# Patient Record
Sex: Female | Born: 1970 | Race: Black or African American | Hispanic: No | Marital: Single | Smoking: Never smoker
Health system: Southern US, Community
[De-identification: ages and names within clinical notes are randomized; demographics above are authoritative.]

## PROBLEM LIST (undated history)

## (undated) DIAGNOSIS — N390 Urinary tract infection, site not specified: Secondary | ICD-10-CM

## (undated) DIAGNOSIS — L0293 Carbuncle, unspecified: Secondary | ICD-10-CM

## (undated) DIAGNOSIS — L0292 Furuncle, unspecified: Secondary | ICD-10-CM

## (undated) HISTORY — PX: OTHER SURGICAL HISTORY: SHX169

## (undated) HISTORY — PX: INCISION AND DRAINAGE ABSCESS ANAL: SUR669

## (undated) HISTORY — DX: Furuncle, unspecified: L02.92

## (undated) HISTORY — PX: TUBAL LIGATION: SHX77

## (undated) HISTORY — PX: INCISE AND DRAIN ABCESS: PRO64

## (undated) HISTORY — DX: Carbuncle, unspecified: L02.93

---

## 1999-02-02 ENCOUNTER — Encounter: Payer: Self-pay | Admitting: Emergency Medicine

## 1999-02-02 ENCOUNTER — Emergency Department (HOSPITAL_COMMUNITY): Admission: EM | Admit: 1999-02-02 | Discharge: 1999-02-02 | Payer: Self-pay | Admitting: Emergency Medicine

## 1999-02-26 ENCOUNTER — Emergency Department (HOSPITAL_COMMUNITY): Admission: EM | Admit: 1999-02-26 | Discharge: 1999-02-26 | Payer: Self-pay | Admitting: Emergency Medicine

## 1999-02-28 ENCOUNTER — Emergency Department (HOSPITAL_COMMUNITY): Admission: EM | Admit: 1999-02-28 | Discharge: 1999-02-28 | Payer: Self-pay | Admitting: Emergency Medicine

## 2000-02-23 ENCOUNTER — Encounter (INDEPENDENT_AMBULATORY_CARE_PROVIDER_SITE_OTHER): Payer: Self-pay

## 2000-02-23 ENCOUNTER — Other Ambulatory Visit: Admission: RE | Admit: 2000-02-23 | Discharge: 2000-02-23 | Payer: Self-pay | Admitting: Obstetrics

## 2000-07-11 ENCOUNTER — Emergency Department (HOSPITAL_COMMUNITY): Admission: EM | Admit: 2000-07-11 | Discharge: 2000-07-11 | Payer: Self-pay | Admitting: Emergency Medicine

## 2002-04-30 ENCOUNTER — Emergency Department (HOSPITAL_COMMUNITY): Admission: EM | Admit: 2002-04-30 | Discharge: 2002-04-30 | Payer: Self-pay | Admitting: Emergency Medicine

## 2002-04-30 ENCOUNTER — Encounter: Payer: Self-pay | Admitting: Emergency Medicine

## 2002-05-13 ENCOUNTER — Encounter: Payer: Self-pay | Admitting: Obstetrics and Gynecology

## 2002-05-13 ENCOUNTER — Inpatient Hospital Stay (HOSPITAL_COMMUNITY): Admission: AD | Admit: 2002-05-13 | Discharge: 2002-05-13 | Payer: Self-pay | Admitting: Obstetrics and Gynecology

## 2002-05-26 ENCOUNTER — Encounter: Admission: RE | Admit: 2002-05-26 | Discharge: 2002-05-26 | Payer: Self-pay | Admitting: Obstetrics and Gynecology

## 2002-05-26 ENCOUNTER — Other Ambulatory Visit: Admission: RE | Admit: 2002-05-26 | Discharge: 2002-05-26 | Payer: Self-pay | Admitting: Obstetrics and Gynecology

## 2002-07-07 ENCOUNTER — Ambulatory Visit (HOSPITAL_COMMUNITY): Admission: RE | Admit: 2002-07-07 | Discharge: 2002-07-07 | Payer: Self-pay | Admitting: *Deleted

## 2002-12-30 ENCOUNTER — Emergency Department (HOSPITAL_COMMUNITY): Admission: EM | Admit: 2002-12-30 | Discharge: 2002-12-30 | Payer: Self-pay | Admitting: Emergency Medicine

## 2003-01-01 ENCOUNTER — Emergency Department (HOSPITAL_COMMUNITY): Admission: EM | Admit: 2003-01-01 | Discharge: 2003-01-01 | Payer: Self-pay | Admitting: Emergency Medicine

## 2005-01-24 ENCOUNTER — Emergency Department (HOSPITAL_COMMUNITY): Admission: EM | Admit: 2005-01-24 | Discharge: 2005-01-24 | Payer: Self-pay | Admitting: Family Medicine

## 2005-06-04 ENCOUNTER — Emergency Department (HOSPITAL_COMMUNITY): Admission: EM | Admit: 2005-06-04 | Discharge: 2005-06-04 | Payer: Self-pay | Admitting: Family Medicine

## 2005-06-12 ENCOUNTER — Inpatient Hospital Stay (HOSPITAL_COMMUNITY): Admission: AD | Admit: 2005-06-12 | Discharge: 2005-06-13 | Payer: Self-pay | Admitting: *Deleted

## 2005-06-28 ENCOUNTER — Ambulatory Visit (HOSPITAL_COMMUNITY): Admission: RE | Admit: 2005-06-28 | Discharge: 2005-06-28 | Payer: Self-pay | Admitting: *Deleted

## 2005-07-03 ENCOUNTER — Encounter (INDEPENDENT_AMBULATORY_CARE_PROVIDER_SITE_OTHER): Payer: Self-pay | Admitting: Family Medicine

## 2005-07-03 LAB — CONVERTED CEMR LAB

## 2005-08-07 ENCOUNTER — Ambulatory Visit (HOSPITAL_COMMUNITY): Admission: RE | Admit: 2005-08-07 | Discharge: 2005-08-07 | Payer: Self-pay | Admitting: *Deleted

## 2005-08-07 ENCOUNTER — Ambulatory Visit: Payer: Self-pay | Admitting: *Deleted

## 2005-08-14 ENCOUNTER — Ambulatory Visit (HOSPITAL_COMMUNITY): Admission: RE | Admit: 2005-08-14 | Discharge: 2005-08-14 | Payer: Self-pay | Admitting: *Deleted

## 2006-01-15 ENCOUNTER — Inpatient Hospital Stay (HOSPITAL_COMMUNITY): Admission: AD | Admit: 2006-01-15 | Discharge: 2006-01-17 | Payer: Self-pay | Admitting: Family Medicine

## 2006-01-15 ENCOUNTER — Ambulatory Visit: Payer: Self-pay | Admitting: Obstetrics and Gynecology

## 2006-07-08 ENCOUNTER — Emergency Department (HOSPITAL_COMMUNITY): Admission: EM | Admit: 2006-07-08 | Discharge: 2006-07-08 | Payer: Self-pay | Admitting: Family Medicine

## 2006-07-24 ENCOUNTER — Ambulatory Visit: Payer: Self-pay | Admitting: Family Medicine

## 2006-07-24 ENCOUNTER — Encounter (INDEPENDENT_AMBULATORY_CARE_PROVIDER_SITE_OTHER): Payer: Self-pay | Admitting: Family Medicine

## 2006-09-30 ENCOUNTER — Encounter (INDEPENDENT_AMBULATORY_CARE_PROVIDER_SITE_OTHER): Payer: Self-pay | Admitting: Family Medicine

## 2007-01-15 ENCOUNTER — Emergency Department (HOSPITAL_COMMUNITY): Admission: EM | Admit: 2007-01-15 | Discharge: 2007-01-15 | Payer: Self-pay | Admitting: Family Medicine

## 2007-04-30 ENCOUNTER — Ambulatory Visit: Payer: Self-pay | Admitting: Family Medicine

## 2007-04-30 ENCOUNTER — Encounter (INDEPENDENT_AMBULATORY_CARE_PROVIDER_SITE_OTHER): Payer: Self-pay | Admitting: Family Medicine

## 2007-04-30 ENCOUNTER — Other Ambulatory Visit: Admission: RE | Admit: 2007-04-30 | Discharge: 2007-04-30 | Payer: Self-pay | Admitting: Family Medicine

## 2007-04-30 LAB — CONVERTED CEMR LAB: Pap Smear: NORMAL

## 2008-01-12 ENCOUNTER — Ambulatory Visit: Payer: Self-pay | Admitting: Family Medicine

## 2008-01-12 DIAGNOSIS — L0292 Furuncle, unspecified: Secondary | ICD-10-CM | POA: Insufficient documentation

## 2008-01-12 DIAGNOSIS — L0293 Carbuncle, unspecified: Secondary | ICD-10-CM

## 2008-01-12 HISTORY — DX: Furuncle, unspecified: L02.92

## 2008-01-27 ENCOUNTER — Encounter: Payer: Self-pay | Admitting: *Deleted

## 2008-08-02 ENCOUNTER — Encounter (INDEPENDENT_AMBULATORY_CARE_PROVIDER_SITE_OTHER): Payer: Self-pay | Admitting: Family Medicine

## 2008-08-02 ENCOUNTER — Ambulatory Visit: Payer: Self-pay | Admitting: Family Medicine

## 2008-08-02 ENCOUNTER — Other Ambulatory Visit: Admission: RE | Admit: 2008-08-02 | Discharge: 2008-08-02 | Payer: Self-pay | Admitting: Family Medicine

## 2008-08-03 LAB — CONVERTED CEMR LAB: Pap Smear: NEGATIVE

## 2008-08-05 ENCOUNTER — Encounter (INDEPENDENT_AMBULATORY_CARE_PROVIDER_SITE_OTHER): Payer: Self-pay | Admitting: Family Medicine

## 2008-08-05 LAB — CONVERTED CEMR LAB
AST: 14 units/L (ref 0–37)
Albumin: 4 g/dL (ref 3.5–5.2)
BUN: 10 mg/dL (ref 6–23)
Chloride: 106 meq/L (ref 96–112)
Cholesterol: 149 mg/dL (ref 0–200)
Glucose, Bld: 101 mg/dL — ABNORMAL HIGH (ref 70–99)
LDL Cholesterol: 82 mg/dL (ref 0–99)
Potassium: 3.9 meq/L (ref 3.5–5.3)
Sodium: 141 meq/L (ref 135–145)
Total Bilirubin: 0.7 mg/dL (ref 0.3–1.2)
Total CHOL/HDL Ratio: 2.7
Triglycerides: 62 mg/dL (ref <150)
VLDL: 12 mg/dL (ref 0–40)

## 2009-09-09 ENCOUNTER — Emergency Department (HOSPITAL_COMMUNITY): Admission: EM | Admit: 2009-09-09 | Discharge: 2009-09-09 | Payer: Self-pay | Admitting: Emergency Medicine

## 2009-09-11 ENCOUNTER — Emergency Department (HOSPITAL_COMMUNITY): Admission: EM | Admit: 2009-09-11 | Discharge: 2009-09-11 | Payer: Self-pay | Admitting: Emergency Medicine

## 2009-09-21 ENCOUNTER — Encounter: Payer: Self-pay | Admitting: *Deleted

## 2009-11-16 ENCOUNTER — Encounter: Payer: Self-pay | Admitting: Family Medicine

## 2009-11-16 ENCOUNTER — Ambulatory Visit: Payer: Self-pay | Admitting: Family Medicine

## 2009-11-16 LAB — CONVERTED CEMR LAB
Bilirubin Urine: NEGATIVE
Chlamydia, DNA Probe: NEGATIVE
GC Probe Amp, Genital: NEGATIVE
Glucose, Urine, Semiquant: NEGATIVE
Ketones, urine, test strip: NEGATIVE
Nitrite: NEGATIVE
Protein, U semiquant: 30
Specific Gravity, Urine: >=1.03
Urobilinogen, UA: 0.2
pH: 5.5

## 2009-11-17 ENCOUNTER — Telehealth: Payer: Self-pay | Admitting: *Deleted

## 2010-02-20 ENCOUNTER — Encounter: Payer: Self-pay | Admitting: Sports Medicine

## 2010-05-16 NOTE — Assessment & Plan Note (Signed)
Summary: uti?,df   Vital Signs:  Patient profile:   40 year old female Height:      66 inches Weight:      165 pounds BMI:     26.73 Temp:     99.0 degrees F oral Pulse rate:   72 / minute BP sitting:   128 / 82  (left arm) Cuff size:   regular  Vitals Entered By: Tessie Fass CMA (November 16, 2009 1:32 PM) CC: dysuria/ vaginal discharge Is Patient Diabetic? No Pain Assessment Patient in pain? no        Primary Care Provider:  Drue Dun MD  CC:  dysuria/ vaginal discharge.  History of Present Illness: 40 year old F:  1. Vaginal Discharge: since sexual intercourse with boyfriend  ~ 1 week ago, described as green, frothy, no itch or odor. no abdominal pain, fever/chills, N/V/D. + dysuria. she thinks that she was "scratched on the inside," so urinating burns. no vaginal bleeding.  Current Medications (verified): 1)  None  Allergies (verified): No Known Drug Allergies PMH-FH-SH reviewed for relevance  Review of Systems General:  Denies chills and fever. GU:  Complains of discharge and dysuria; denies abnormal vaginal bleeding and genital sores. Derm:  Denies rash.  Physical Exam  General:  Well-developed, well-nourished, no acute distress.  Alert and oriented x 3. Vitals reviewed. Genitalia:  Normal introitus, 4 external healing cysts (?hidradenitis), copious green, frothy vaginal discharge - samples taken, mucosa pink and moist, and no friaility or hemorrhage.  No internal/external abrasions noted. Psych:  Oriented X3, memory intact for recent and remote, normally interactive, good eye contact, not anxious appearing, and not depressed appearing.     Impression & Recommendations:  Problem # 1:  VAGINAL DISCHARGE (ICD-623.5) Assessment New Wet Prep negative. GC/Chlamydia pending. Orders: Wet Prep- [facility] (89381) [facility]- Est Level  3 (01751)  Problem # 2:  DYSURIA (ICD-788.1) Assessment: New UA + LE, WBC TNTC. Rx Keflex. Her updated medication list for this  problem includes:    Cephalexin 500 Mg Tabs (Cephalexin) .Marland Kitchen... Take one by mouth 3 times daily x 3 days  Orders: Urinalysis-[facility] (00000) GC/Chlamydia-[facility] (87591/87491) [facility]- Est Level  3 (02585)  Problem # 3:  UTI (ICD-599.0)  Her updated medication list for this problem includes:    Cephalexin 500 Mg Tabs (Cephalexin) .Marland Kitchen... Take one by mouth 3 times daily x 3 days  Problem # 4:  BOILS, RECURRENT (ICD-680.9)  Complete Medication List: 1)  Cephalexin 500 Mg Tabs (Cephalexin) .... Take one by mouth 3 times daily x 3 days  Patient Instructions: 1)  You have a UTI. I am prescribing Kelex for this. 2)  We will call with the results of your GC/Chlamydia tests. Prescriptions: CEPHALEXIN 500 MG  TABS (CEPHALEXIN) take one by mouth 3 times daily x 3 days  #9 x 0   Entered and Authorized by:   Helane Rima DO   Signed by:   Helane Rima DO on 11/16/2009   Method used:   Electronically to        [facility]  [address] [phone]* (retail)       [address]       [place], [place]  [id number]       Ph: [phone] or [phone]       Fax: [phone]   RxID:   [id number] CEPHALEXIN 500 MG  TABS (CEPHALEXIN) take one by mouth 3 times daily x 3 days  #  9 x 0   Entered and Authorized by:   Helane Rima DO   Signed by:   Helane Rima DO on 11/16/2009   Method used:   Print then Give to Patient   RxID:   [id number]   Laboratory Results   Urine Tests  Date/Time Received: November 16, 2009 1:41 PM  Date/Time Reported: November 16, 2009 2:09 PM   Routine Urinalysis   Color: yellow Appearance: slightly Cloudy Glucose: negative   (Normal Range: Negative) Bilirubin: small;  reflex ictotest = negative   (Normal Range: Negative) Ketone: negative   (Normal Range: Negative) Spec. Gravity: >=1.030   (Normal Range: 1.003-1.035) Blood: large   (Normal Range: Negative) pH: 5.5   (Normal Range: 5.0-8.0) Protein: 30   (Normal Range: Negative) Urobilinogen:  0.2   (Normal Range: 0-1) Nitrite: negative   (Normal Range: Negative) Leukocyte Esterace: moderate   (Normal Range: Negative)  Urine Microscopic WBC/HPF: TNTC RBC/HPF: 0-3 Bacteria/HPF: 2+ Mucous/HPF: 2+ Epithelial/HPF: 5-10 Yeast/HPF: occ    Comments: 4 cc spun ...............test performed by......Marland KitchenBonnie A. [place]az[place], MLS (ASCP)cm  Date/Time Received: November 16, 2009 1:49 PM  Date/Time Reported: November 16, 2009 2:03 PM   [employer] Source: vaginal WBC/hpf: >20 Bacteria/hpf: 3+ Clue cells/hpf: none Yeast/hpf: none Trichomonas/hpf: none Comments: ...........test performed by...........Marland KitchenTerese Door, CMA

## 2010-05-16 NOTE — Miscellaneous (Signed)
Summary: NPI given  Clinical Lists Changes gave [facility] our NPI. she had gone top [facility] ED for an abcess on her back. they sent her to [facility]. she has a f/u appt on the 13th of June. NPI given.Golden Circle RN  September 21, 2009 11:37 AM

## 2010-05-16 NOTE — Miscellaneous (Signed)
  Clinical Lists Changes  Problems: Removed problem of UTI (ICD-599.0) Removed problem of VAGINAL DISCHARGE (ICD-623.5) Removed problem of CONTACT OR EXPOSURE TO OTHER VIRAL DISEASES (ICD-V01.79) Removed problem of DYSURIA (ICD-788.1) Removed problem of SCREENING OTHER&UNSPEC CARDIOVASCULAR CONDITIONS (ICD-V81.2) Removed problem of SCREENING FOR LIPOID DISORDERS (ICD-V77.91) Removed problem of SCREENING FOR MALIGNANT NEOPLASM OF THE CERVIX (ICD-V76.2) Removed problem of HEALTH MAINTENANCE EXAM (ICD-V70.0) Removed problem of ROUTINE GYNECOLOGICAL EXAMINATION (ICD-V72.31)

## 2010-05-16 NOTE — Progress Notes (Signed)
Summary: re: std results/ts  ---- Converted from flag ---- ---- 11/17/2009 7:59 AM, Helane Rima DO wrote: please let patient know that her GC/Chlm tests were negative. if she continues to have vaginal discharge after another 1-2 weeks, f/u for retest. ------------------------------  called pt and informed of above.

## 2010-05-20 ENCOUNTER — Emergency Department (HOSPITAL_COMMUNITY)
Admission: EM | Admit: 2010-05-20 | Discharge: 2010-05-20 | Disposition: A | Discharge status: Home / Self Care | Payer: Medicaid Other | Attending: Emergency Medicine | Admitting: Emergency Medicine

## 2010-05-20 DIAGNOSIS — K649 Unspecified hemorrhoids: Secondary | ICD-10-CM | POA: Insufficient documentation

## 2010-05-20 DIAGNOSIS — K6289 Other specified diseases of anus and rectum: Secondary | ICD-10-CM | POA: Insufficient documentation

## 2010-08-25 ENCOUNTER — Encounter: Payer: Medicaid Other | Admitting: Sports Medicine

## 2010-09-01 NOTE — Op Note (Signed)
NAMEJULISSA, Mallory Lin              ACCOUNT NO.:  [id number]   MEDICAL RECORD NO.:  [id number]          PATIENT TYPE:  INP   LOCATION:  9106                          FACILITY:  [facility]   PHYSICIAN:  [name], M.D.DATE OF BIRTH:  [birth year]   DATE OF PROCEDURE:  01/16/2006  DATE OF DISCHARGE:                                 OPERATIVE REPORT   PREOPERATIVE DIAGNOSIS:  Multiparity, desires permanent sterilization.   POST PROCEDURE DIAGNOSIS:  Multiparity, desires permanent sterilization.   PROCEDURE:  Postpartum tubal ligation with Filshie clips.   ANESTHESIA:  General.   SURGEON:  Tinnie Gens, M.D.   ASSISTANT:  Carolanne Grumbling, M.D.   ESTIMATED BLOOD LOSS:  Minimal.   PROCEDURE:  The patient was taken to the operating room where general  anesthesia was done and found to be adequate.  She was then prepped and  draped in the usual sterile fashion.  The area of skin that was about to be  incised was anesthetized with 0.25% Marcaine, then infraumbilical incision  was made and carried through until the abdominal cavity was entered.  Next,  the left fallopian tube was identified and grasped with Babcock clamp and  brought through the incision.  It was then followed out to the fimbriae.  Next, a Filshie clip was placed without difficulty.  Fallopian tube was then  returned to the abdominal cavity without difficulty.  Attention was then  turned to the right fallopian tube which, in a similar fashion, was grasped  with a Babcock clamp and followed down to the fimbriae.  A Filshie clip was  then placed, completely occluding the fallopian tube.  Fallopian tube was  returned to the cavity without difficulty.  Excellent hemostasis was noted.  Fascia was closed with 0 Vicryl in a running fashion.  Skin was closed with  4-0 Vicryl.  The patient tolerated procedure well.  There were no  complications.           ______________________________  [name], M.D.     TLW/MEDQ  D:   01/16/2006  T:  01/17/2006  Job:  [id number]

## 2010-09-18 ENCOUNTER — Emergency Department (HOSPITAL_COMMUNITY)
Admission: EM | Admit: 2010-09-18 | Discharge: 2010-09-18 | Disposition: A | Discharge status: Home / Self Care | Payer: Medicaid Other | Attending: Emergency Medicine | Admitting: Emergency Medicine

## 2010-09-18 ENCOUNTER — Emergency Department (HOSPITAL_COMMUNITY): Payer: Medicaid Other

## 2010-09-18 DIAGNOSIS — J069 Acute upper respiratory infection, unspecified: Secondary | ICD-10-CM | POA: Insufficient documentation

## 2010-09-18 DIAGNOSIS — R509 Fever, unspecified: Secondary | ICD-10-CM | POA: Insufficient documentation

## 2010-09-18 DIAGNOSIS — J029 Acute pharyngitis, unspecified: Secondary | ICD-10-CM | POA: Insufficient documentation

## 2010-09-18 DIAGNOSIS — R05 Cough: Secondary | ICD-10-CM | POA: Insufficient documentation

## 2010-09-18 DIAGNOSIS — IMO0001 Reserved for inherently not codable concepts without codable children: Secondary | ICD-10-CM | POA: Insufficient documentation

## 2010-09-18 DIAGNOSIS — R1110 Vomiting, unspecified: Secondary | ICD-10-CM | POA: Insufficient documentation

## 2010-09-18 DIAGNOSIS — R059 Cough, unspecified: Secondary | ICD-10-CM | POA: Insufficient documentation

## 2010-11-24 ENCOUNTER — Ambulatory Visit (INDEPENDENT_AMBULATORY_CARE_PROVIDER_SITE_OTHER): Payer: Medicaid Other | Admitting: Family Medicine

## 2010-11-24 ENCOUNTER — Encounter: Payer: Self-pay | Admitting: Family Medicine

## 2010-11-24 VITALS — BP 129/76 | HR 62 | Ht 65.5 in | Wt 161.0 lb

## 2010-11-24 DIAGNOSIS — L732 Hidradenitis suppurativa: Secondary | ICD-10-CM | POA: Insufficient documentation

## 2010-11-24 NOTE — Patient Instructions (Addendum)
It was good to meet you today. I will refer you to surgery to see if they have any further recommendations.  Sometimes they are successful in helping people with problems like yours.   It has a long fancy name, as below.     Hidradenitis Suppurativa, Sweat Gland Abscess Hidradenitis suppurativa is a long lasting (chronic), uncommon disease of the sweat glands. With this, boil-like lumps and scarring develop in the groin, some times under the arms (axillae), and under the breasts. It may also uncommonly occur behind the ears, in the crease of the buttocks, and around the genitals.   CAUSES The cause is from a blocking of the sweat glands. They then become infected. It may cause drainage and odor. It is not contagious. So it cannot be given to someone else. It most often shows up in puberty (about 37 to 40 years of age). But it may happen much later. It is similar to acne which is a disease of the sweat glands. This condition is slightly more common in African-Americans and women. SYMPTOMS  Hidradenitis usually starts as one or more red, tender, swellings in the groin or under the arms (axilla).   Over a period of hours to days the lesions get larger. They often open to the skin surface, draining clear to yellow-colored fluid.   The infected area heals with scarring.  DIAGNOSIS Your caregiver makes this diagnosis by looking at you. Sometimes cultures (growing germs on plates in the lab) may be taken. This is to see what germ (bacterium) is causing the infection.   TREATMENT & HOME CARE INSTRUCTIONS  Topical germ killing medicine applied to the skin (antibiotics) are the treatment of choice. Antibiotics taken by mouth (systemic) are sometimes needed when the condition is getting worse or is severe.   Avoid tight-fitting clothing which traps moisture in.   Dirt does not cause hidradenitis and it is not caused by poor hygiene.   Involved areas should be cleaned daily using an antibacterial soap.  Some patients find that the liquid form of Lever 2000, applied to the involved areas as a lotion after bathing, can help reduce the odor related to this condition.   Sometimes surgery is needed to drain infected areas or remove scarred tissue. Removal of large amounts of tissue is used only in severe cases.   Birth control pills may be helpful.   Oral retinoids (vitamin A derivatives) for 6 to 12 months which are effective for acne may also help this condition.   Weight loss will improve but not cure hidradenitis. It is made worse by being overweight. But the condition is not caused by being overweight.   This condition is more common in people who have had acne.   It may become worse under stress.  There is no medical cure for hidradenitis. It can be controlled, but not cured. The condition usually continues for years with periods of getting worse and getting better (remission). Document Released: 11/15/2003 Document Re-Released: 01/10/2008 [employer] Patient Information 2011 [place], [place].

## 2010-11-24 NOTE — Progress Notes (Signed)
  Subjective:    Patient ID: Mallory Lin, female    DOB: Jun 05, 1970, 40 y.o.   MRN: [id number]  HPI 40 yo F here for CPE and b/c of recurrent boils:  1.  Boils:  Has had problem for greater than 6 years.  Mother and sister also with recurrent boils.  Has had multiple I&D's in past, also drains them on her own as well.  Recur every 1-2 months, mostly in axillae and labial folds.  Would like to know what else she can do for this.  No fevers, chills, current drainage from sites.     Review of Systems The patient denies fever, unusual weight change, decreased hearing, chest pain, palpitations, pre-syncopal or syncopal episodes, dyspnea on exertion, prolonged cough, hemoptysis, change in bowel habits, melena, hematochezia, severe indigestion/heartburn, nausea/vomiting/abdominal pain, genital sores, muscle weakness, difficulty walking, abnormal bleeding.       Objective:   Physical Exam Gen:  Alert, cooperative patient who appears stated age in no acute distress.  Vital signs reviewed. HEENT:  [place]/AT.  EOMI, PERRL.  MMM, tonsils non-erythematous, non-edematous.  External ears WNL, Bilateral TM's normal without retraction, redness or bulging.  Neck: No masses or thyromegaly or limitation in range of motion.  No cervical lymphadenopathy. Pulm:  Clear to auscultation bilaterally with good air movement.  No wheezes or rales noted.   Cardiac:  Regular rate and rhythm without murmur auscultated.  Good S1/S2. Abd:  Soft/nondistended/nontender.  Good bowel sounds throughout all four quadrants.  No masses noted.  Ext:  No clubbing/cyanosis/erythema.  No edema noted bilateral lower extremities.   Skin:  Patient examined with chaperone.  Multiple areas of induration with evidence of recent self-instrumentaton and drainage.  No current areas of fluctuance.  Labial folds also with multiple areas of induration, R > L.  No areas of fluctuance here either        Assessment & Plan:

## 2010-11-24 NOTE — Assessment & Plan Note (Addendum)
Long-standing problem for patient. Plan to refer to surgery for further recommendations.  Nothing to drain today.   Do not think adding antibiotics to regimen would help at this time.   Will fu after visit with surgery.

## 2010-12-06 ENCOUNTER — Telehealth: Payer: Self-pay | Admitting: *Deleted

## 2010-12-06 NOTE — Telephone Encounter (Signed)
Informed pt of appt at [facility] [address] on 08.28.2012 @ 330 pm with Dr. Gerrit Friends. Told her that if she is unable to keep this appt she can call to cancel/reschedule at [phone]. Pt agreed and understood.Mallory Lin

## 2010-12-12 ENCOUNTER — Ambulatory Visit (INDEPENDENT_AMBULATORY_CARE_PROVIDER_SITE_OTHER): Payer: Medicaid Other | Admitting: Surgery

## 2011-01-10 ENCOUNTER — Encounter (INDEPENDENT_AMBULATORY_CARE_PROVIDER_SITE_OTHER): Payer: Self-pay | Admitting: Surgery

## 2011-01-10 ENCOUNTER — Ambulatory Visit (INDEPENDENT_AMBULATORY_CARE_PROVIDER_SITE_OTHER): Payer: Medicaid Other | Admitting: Surgery

## 2011-01-10 DIAGNOSIS — L732 Hidradenitis suppurativa: Secondary | ICD-10-CM

## 2011-01-10 NOTE — Patient Instructions (Signed)
Wound care as instructed.  Add anti-bacterial soap.  TMG

## 2011-01-10 NOTE — Progress Notes (Signed)
Visit Diagnoses: 1. Hidradenitis suppurativa     HISTORY: Patient is a 40 year old female previously treated in our practice for an abscess of the buttock. Patient has long-standing hidradenitis. This is being managed by her primary care physician. Patient notes involvement of both axillae and both groins and the pubic region. Currently hidradenitis is under reasonable control. She has never been evaluated by a dermatologist.   PERTINENT REVIEW OF SYSTEMS: No current drainage. Minimal discomfort.   EXAM: HEENT: normocephalic; pupils equal and reactive; sclerae clear; dentition good; mucous membranes moist NECK:  symmetric on extension; no palpable anterior or posterior cervical lymphadenopathy; no supraclavicular masses; no tenderness CHEST: clear to auscultation bilaterally without rales, rhonchi, or wheezes CARDIAC: regular rate and rhythm without significant murmur; peripheral pulses are full EXT:  non-tender without edema; no deformity NEURO: no gross focal deficits; no sign of tremor SKIN:  Stigmata of hidradenitis involving both axillae, greater on the left than on the right, and evidence of hidradenitis involving the medial right thigh and suprapubic region. No active drainage. No fluctuance. No sign of cellulitis.   IMPRESSION: Hidradenitis suppurativa involving the axillae, right groin, and suprapubic region, quiescent   PLAN: Usual instructions for management of hidradenitis are provided to the patient. Written information on management of hidradenitis is also given to the patient.  At this point there is no roll for acute surgical intervention.  Patient may benefit from consultation with dermatology for long-term management. I will leave this up to her primary care provider.  Patient will return to our practice as needed.   Velora Heckler, MD, FACS General & Endocrine Surgery [facility]

## 2011-02-20 ENCOUNTER — Encounter: Payer: Self-pay | Admitting: Family Medicine

## 2011-02-20 ENCOUNTER — Ambulatory Visit (INDEPENDENT_AMBULATORY_CARE_PROVIDER_SITE_OTHER): Payer: Medicaid Other | Admitting: Family Medicine

## 2011-02-20 DIAGNOSIS — L732 Hidradenitis suppurativa: Secondary | ICD-10-CM

## 2011-02-20 MED ORDER — SULFAMETHOXAZOLE-TMP DS 800-160 MG PO TABS: 1.0000 | ORAL_TABLET | Freq: Two times a day (BID) | ORAL | Status: DC

## 2011-02-20 NOTE — Assessment & Plan Note (Signed)
This is a chronic issue for patient. Appreciate general surgery consult-no indication for surgery. Will give prescription for Bactrim DS one tablet twice a day for 10 days. Will refer patient to dermatology for further management and recommendations. Advised patient to return to clinic if she experiences persistent fevers, nausea vomiting, or worsening boils.  If symptoms do not improve, patient to followup with PCP in 2 weeks.

## 2011-02-20 NOTE — Patient Instructions (Signed)
It was nice to meet you today. Please take Bactrim as directed x 10 days. Please follow up with your PCP if symptoms do not improve in 2 weeks. I will refer you to a dermatologist for further management. Thank you.

## 2011-02-20 NOTE — Progress Notes (Signed)
  Subjective:    Patient ID: [name], female    DOB: 12-May-1970, 40 y.o.   MRN: [id number]  HPI  Patient presents as a work in for multiple recurrent boils. Patient states that she typically has multiple boils on her back, groin area, and arms. Today, however, she has only 2 boils located on bilateral axillary regions. Patient was seen by [facility] surgery. At that visit, patient was given instructions for supportive care of hydradenitis suppurativa. There was no indication for surgery at that time. Patient says that the lesion under her right axillary recently drained pus and minimal blood. Lesions are nontender and not itchy. She describes them as irritating and would like to have them removed.  Patient denies any fever, chills, night sweats, rash. Endorses dry skin and a few boils under her arms. No evidence of acute infection.   Review of Systems  Per history of present illness     Objective:   Physical Exam  General: in no acute distress Skin: right axilla- 2 lesions, mild erythema, no active pus, bleeding, or drainage; left axilla - one round, fluctuant boil (about 2 mm x 3 mm), no active pus, bleeding, or drainage      Assessment & Plan:

## 2011-03-30 ENCOUNTER — Encounter: Payer: Self-pay | Admitting: Family Medicine

## 2011-03-30 ENCOUNTER — Ambulatory Visit (INDEPENDENT_AMBULATORY_CARE_PROVIDER_SITE_OTHER): Payer: Medicaid Other | Admitting: Family Medicine

## 2011-03-30 VITALS — BP 132/80 | HR 60 | Temp 98.6°F | Ht 66.0 in | Wt 165.0 lb

## 2011-03-30 DIAGNOSIS — R300 Dysuria: Secondary | ICD-10-CM

## 2011-03-30 DIAGNOSIS — R1031 Right lower quadrant pain: Secondary | ICD-10-CM | POA: Insufficient documentation

## 2011-03-30 LAB — POCT URINALYSIS DIPSTICK
Bilirubin, UA: NEGATIVE
Ketones, UA: NEGATIVE
Protein, UA: NEGATIVE
Spec Grav, UA: 1.02
Urobilinogen, UA: 1
pH, UA: 7

## 2011-03-30 NOTE — Assessment & Plan Note (Addendum)
Benign abdomen. Not a surgical problem. No red flags on history or physical. Check a UA as patient is diagnosed with UTI previously with similar symptoms. Await results Ibuprofen for relief.

## 2011-03-30 NOTE — Progress Notes (Signed)
  Subjective:    Patient ID: [name], female    DOB: [birth year], 40 y.o.   MRN: [id number]  HPI 1. Right-sided pain: Patient complaining of right-sided lower quadrant pain for past several days. She states she feels as when she lies down and moves her leg. She said sometimes it is sharp stabbing pain other times it's a dull ache. She states she has similar pain in the past when she had UTI. No fevers or chills. Eating well. No nausea or vomiting. No pain elsewhere. States pain is about 3/10 in intensity. Does not think she's had any dysuria. No vaginal discharge. Menses are regular. No current vaginal bleeding.  Review of Systems See HPI above for review of systems.       Objective:   Physical Exam Gen:  Alert, cooperative patient who appears stated age in no acute distress.  Vital signs reviewed. Abd:  Soft/nondistended/nontender.  Good bowel sounds throughout all four quadrants.  No masses noted. Unable to reproduce abdominal tenderness or pain. No guarding or rebound. MSK:  Internal and external rotation of the hip without pain and with full active and passive range of motion bilaterally. Knee flexion and hip flexion bilaterally without pain or tenderness.       Assessment & Plan:

## 2011-04-01 LAB — URINE CULTURE

## 2011-07-12 ENCOUNTER — Encounter (HOSPITAL_COMMUNITY): Payer: Self-pay | Admitting: Emergency Medicine

## 2011-07-12 ENCOUNTER — Emergency Department (HOSPITAL_COMMUNITY)
Admission: EM | Admit: 2011-07-12 | Discharge: 2011-07-12 | Disposition: A | Discharge status: Home / Self Care | Payer: Medicaid Other | Source: Home / Self Care | Attending: Emergency Medicine | Admitting: Emergency Medicine

## 2011-07-12 DIAGNOSIS — N390 Urinary tract infection, site not specified: Secondary | ICD-10-CM

## 2011-07-12 DIAGNOSIS — S39012A Strain of muscle, fascia and tendon of lower back, initial encounter: Secondary | ICD-10-CM

## 2011-07-12 DIAGNOSIS — S335XXA Sprain of ligaments of lumbar spine, initial encounter: Secondary | ICD-10-CM

## 2011-07-12 HISTORY — DX: Urinary tract infection, site not specified: N39.0

## 2011-07-12 LAB — POCT URINALYSIS DIP (DEVICE)
Glucose, UA: NEGATIVE mg/dL
Ketones, ur: NEGATIVE mg/dL
Specific Gravity, Urine: 1.015 (ref 1.005–1.030)
pH: 6 (ref 5.0–8.0)

## 2011-07-12 LAB — POCT PREGNANCY, URINE: Preg Test, Ur: NEGATIVE

## 2011-07-12 MED ORDER — NITROFURANTOIN MONOHYD MACRO 100 MG PO CAPS: 100.0000 mg | ORAL_CAPSULE | Freq: Two times a day (BID) | ORAL | Status: AC

## 2011-07-12 MED ORDER — CYCLOBENZAPRINE HCL 10 MG PO TABS: 10.0000 mg | ORAL_TABLET | Freq: Three times a day (TID) | ORAL | Status: AC | PRN

## 2011-07-12 MED ORDER — MELOXICAM 15 MG PO TABS: 15.0000 mg | ORAL_TABLET | Freq: Every day | ORAL | Status: DC

## 2011-07-12 NOTE — Discharge Instructions (Signed)
You start doing back exercises when you're feeling a little bit better. Hold in each exercise for 5-10 seconds and do 5-10 repetitions. Do this once to twice a day. Take the medication as written. You may take 1 g of Tylenol up to 4 times a day in addition to the meloxicam. This is an effective combination for pain. Drink extra fluids. Make sure you finish all the antibiotics even if you're feeling better. Followup with your doctor if you're not getting better in 3 days. Sometimes blood and urine can mean that you have a kidney stone. Return to the ER for fever above 100.4, if you pain changes or gets worse, if you start vomiting, or for any other concerns.

## 2011-07-12 NOTE — ED Notes (Signed)
HERE WITH LEFT LOWER BACK PAIN NON RADIATING THAT STARTED X 1WEEK AGO.PT DENIES INJURY OR STRAIN OR URINE PROBLEMS.ACHY PAIN INTEMITT UNRELIEVED BY ADVIL

## 2011-07-12 NOTE — ED Provider Notes (Signed)
History     CSN: [id number]  Arrival date & time 07/12/11  1017   First MD Initiated Contact with Patient 07/12/11 1036      Chief Complaint  Patient presents with  . Back Pain    (Consider location/radiation/quality/duration/timing/severity/associated sxs/prior treatment) HPI Comments: Patient reports dull, achy, nonradiating, left lower back pain starting a week ago. States it lasts minutes and then resolve. Pain is aggravated with hip flexion, bending forward. States that she has been going up a lot of stairs, and leaning forward, bending over to pick up her child more recently. She's been trying 1 tab Aleve up to 4 times a day without relief. No recent or remote history of injury she back. No history of MVC. No urinary urgency, frequency, hematuria, cloudy or oderous urine. No history of kidney stones. Patient does have a history of UTIs. No history of cancer, IVDU, history of prolonged steroid use, osteoporosis.  ROS as noted in HPI. All other ROS negative.   Patient is a 41 y.o. female presenting with back pain. The history is provided by the patient. No language interpreter was used.  Back Pain  This is a new problem. The current episode started more than 1 week ago. The problem occurs daily. The problem has not changed since onset.The pain is associated with no known injury. The pain is present in the lumbar spine. The quality of the pain is described as aching. The pain does not radiate. The symptoms are aggravated by bending, certain positions and twisting. Pertinent negatives include no chest pain, no fever, no numbness, no abdominal pain, no abdominal swelling, no bowel incontinence, no perianal numbness, no bladder incontinence, no dysuria, no pelvic pain, no leg pain, no paresthesias, no paresis, no tingling and no weakness. Treatments tried: Aleve 1 tab 4 times a day. The treatment provided no relief.    Past Medical History  Diagnosis Date  . Recurrent boils   . UTI (lower  urinary tract infection)     Past Surgical History  Procedure Date  . Incision and drainage abscess anal   . Tubal ligation   . Incise and drain abcess     for hidradenitis    History reviewed. No pertinent family history.  History  Substance Use Topics  . Smoking status: Never Smoker   . Smokeless tobacco: Not on file  . Alcohol Use: No    OB History    Grav Para Term Preterm Abortions TAB SAB Ect Mult Living                  Review of Systems  Constitutional: Negative for fever.  Cardiovascular: Negative for chest pain.  Gastrointestinal: Negative for abdominal pain and bowel incontinence.  Genitourinary: Negative for bladder incontinence, dysuria and pelvic pain.  Musculoskeletal: Positive for back pain.  Neurological: Negative for tingling, weakness, numbness and paresthesias.    Allergies  Review of patient's allergies indicates no known allergies.  Home Medications   Current Outpatient Rx  Name Route Sig Dispense Refill  . CYCLOBENZAPRINE HCL 10 MG PO TABS Oral Take 1 tablet (10 mg total) by mouth 3 (three) times daily as needed for muscle spasms. 20 tablet 0  . MELOXICAM 15 MG PO TABS Oral Take 1 tablet (15 mg total) by mouth daily. 14 tablet 0  . NITROFURANTOIN MONOHYD MACRO 100 MG PO CAPS Oral Take 1 capsule (100 mg total) by mouth 2 (two) times daily. 10 capsule 0    BP 127/75  Pulse 68  Temp(Src) 98.2 F (36.8 C) (Oral)  SpO2 100%  LMP 07/04/2011  Physical Exam  Nursing note and vitals reviewed. Constitutional: She is oriented to person, place, and time. She appears well-developed and well-nourished. No distress.  HENT:  Head: Normocephalic and atraumatic.  Eyes: Conjunctivae and EOM are normal.  Neck: Normal range of motion.  Cardiovascular: Normal rate, regular rhythm, normal heart sounds and intact distal pulses.   Pulmonary/Chest: Effort normal and breath sounds normal.  Abdominal: Soft. Bowel sounds are normal. She exhibits no distension.  There is no tenderness. There is no rebound, no guarding and no CVA tenderness.  Musculoskeletal: Normal range of motion.       Pain aggravated with active hip flexion left-side. No bony, muscular tenderness, muscle spasm. Bilateral lower extremities nontender, baseline ROM with intact PT pulses, No pain with PROM hips bilaterally. SLR neg bilaterally. Sensation baseline light touch bilaterally for Pt, DTR's symmetric and intact bilaterally KJ, Motor symmetric bilateral 5/5 hip flexion, quadriceps, hamstrings, EHL, foot dorsiflexion, foot plantarflexion, gait normal.   Neurological: She is alert and oriented to person, place, and time.  Skin: Skin is warm and dry.  Psychiatric: She has a normal mood and affect. Her behavior is normal. Judgment and thought content normal.    ED Course  Procedures (including critical care time)  Labs Reviewed  POCT URINALYSIS DIP (DEVICE) - Abnormal; Notable for the following:    Hgb urine dipstick MODERATE (*)    Leukocytes, UA SMALL (*) Biochemical Testing Only. Please order routine urinalysis from main lab if confirmatory testing is needed.   All other components within normal limits  POCT PREGNANCY, URINE   No results found. Results for orders placed during the hospital encounter of 07/12/11  POCT URINALYSIS DIP (DEVICE)      Component Value Range   Glucose, UA NEGATIVE  NEGATIVE (mg/dL)   Bilirubin Urine NEGATIVE  NEGATIVE    Ketones, ur NEGATIVE  NEGATIVE (mg/dL)   Specific Gravity, Urine 1.015  1.005 - 1.030    Hgb urine dipstick MODERATE (*) NEGATIVE    pH 6.0  5.0 - 8.0    Protein, ur NEGATIVE  NEGATIVE (mg/dL)   Urobilinogen, UA 0.2  0.0 - 1.0 (mg/dL)   Nitrite NEGATIVE  NEGATIVE    Leukocytes, UA SMALL (*) NEGATIVE   POCT PREGNANCY, URINE      Component Value Range   Preg Test, Ur NEGATIVE  NEGATIVE      1. UTI (lower urinary tract infection)   2. Lumbar strain       MDM  Udip noted. Patient states that she is not on menses, but  is having irregular vaginal bleeding over the past year. Thinks she is perimenopausal. No history of kidney stones.  Results suggestive of  UTI, this patient's had these in the past. However, H&P most consistent with lumbar strain.. Will treat both, the cath Macrobid, and back pain with NSAIDs and Flexeril, and have her followup with her primary care physician for repeat urinalysis if she is not getting better. Discussed results and plan with patient, who agrees.  Luiz Blare, MD 07/12/11 [phone]

## 2011-11-15 ENCOUNTER — Ambulatory Visit (INDEPENDENT_AMBULATORY_CARE_PROVIDER_SITE_OTHER): Payer: Medicaid Other | Admitting: Family Medicine

## 2011-11-15 ENCOUNTER — Encounter: Payer: Self-pay | Admitting: Family Medicine

## 2011-11-15 VITALS — BP 146/82 | HR 87 | Temp 97.8°F | Ht 66.0 in | Wt 165.7 lb

## 2011-11-15 DIAGNOSIS — L0292 Furuncle, unspecified: Secondary | ICD-10-CM

## 2011-11-15 DIAGNOSIS — L0293 Carbuncle, unspecified: Secondary | ICD-10-CM

## 2011-11-15 MED ORDER — SULFAMETHOXAZOLE-TMP DS 800-160 MG PO TABS: 1.0000 | ORAL_TABLET | Freq: Two times a day (BID) | ORAL | Status: AC

## 2011-11-15 NOTE — Progress Notes (Signed)
Patient ID: [name], female   DOB: 07-31-1970, 41 y.o.   MRN: [id number]  Patient seen and examined with MS-III.  Discussed assessment and plan with Gladstone Pih and I agree with his above documented findings.  Briefly, this patient has been diagnosed with recurrent boils under armpit areas.  She drains them at home, but says they keep coming back.  Patient denies any systemic symptoms.  Physical Exam: Filed Vitals:   11/15/11 1436  BP: 146/82  Pulse: 87  Temp: 97.8 F (36.6 C)   General: pleasant, not ill appearing, in NAD Skin: multiple boils located axillary region bilaterally; one or two lesions have been opened and drained and are healing well; few lesions are more acute, swollen, red, and indurated; mild tenderness on palpation; no signs of active bleeding or drainage MSK: normal ROM   Plan:  See Problem List

## 2011-11-15 NOTE — Progress Notes (Signed)
Subjective:     Patient ID: [name], female   DOB: 1970/09/09, 41 y.o.   MRN: [id number]  HPI Mallory Lin is a 41 y/o woman with a history of hidradenitis suppurativa who comes into clinic today with "boils" under her arms.   She states that these are the same boils she always gets, and this is a longstanding problem. She usually drains them herself, but recently they have been so bothersome she decided to come to the doctor. They are mildly painful. She states she received antibiotics in the past, and those have worked well, and she is interested if it is possible for her to get a prescription for those today.  She denies any systemic symptoms, including fever, chills, nausea, vomiting, or changes in appetite.   Review of Systems As per above.     Objective:   Physical Exam General: Well appearing, no acute distress Skin: Left axillary region:raised, fluctuant, lesion with slight drainage and an erythematous base on the anterior aspect of her axilla, with another raised, fluctuant, non-erythematous lesion with no drainage on the anterior aspect of her arm Right axillary region:raised, fluctuant, non-erythematous lesion, no drainage, mild tenderness to palpation    Assessment/Plan  1. Boils: Recurrence of patients previously diagnosed hidradenitis suppurativa. Will prescribe a 14 day course of Bactrim, with instructions to return in two weeks to evaluate whether or not any of the lesions require I&D or further management.

## 2011-11-15 NOTE — Patient Instructions (Addendum)
Take antibiotics for 10 days. Follow up with Dr. Tye Savoy in 10-14 days for possible incision and drainage. If you develop persistent fevers, chills, nausea/vomiting, or worsening skin boils, please return to clinic or report to ED.

## 2011-11-15 NOTE — Assessment & Plan Note (Signed)
Patient has a history of recurrent boils, axillary regions bilaterally.  She drains them at home, but says boils keep coming back.  Will treat with Bactrim DS x 10 days.  Discussed with patient that she will need to have them drained completely to prevent recurrence.  Patient to return in 10-14 days for possible incision and drainage.  Red flags reviewed per AVS.

## 2011-11-26 ENCOUNTER — Ambulatory Visit: Payer: Medicaid Other | Admitting: Family Medicine

## 2012-01-10 ENCOUNTER — Emergency Department (HOSPITAL_COMMUNITY)
Admission: EM | Admit: 2012-01-10 | Discharge: 2012-01-10 | Disposition: A | Discharge status: Home / Self Care | Payer: Medicaid Other | Source: Home / Self Care

## 2012-01-10 ENCOUNTER — Ambulatory Visit (INDEPENDENT_AMBULATORY_CARE_PROVIDER_SITE_OTHER): Payer: Medicaid Other | Admitting: Family Medicine

## 2012-01-10 ENCOUNTER — Encounter: Payer: Self-pay | Admitting: Family Medicine

## 2012-01-10 VITALS — BP 138/98 | HR 56 | Temp 97.9°F | Ht 66.0 in | Wt 169.0 lb

## 2012-01-10 DIAGNOSIS — S161XXA Strain of muscle, fascia and tendon at neck level, initial encounter: Secondary | ICD-10-CM | POA: Insufficient documentation

## 2012-01-10 DIAGNOSIS — S139XXA Sprain of joints and ligaments of unspecified parts of neck, initial encounter: Secondary | ICD-10-CM

## 2012-01-10 MED ORDER — CYCLOBENZAPRINE HCL 5 MG PO TABS: 5.0000 mg | ORAL_TABLET | Freq: Three times a day (TID) | ORAL | Status: DC | PRN

## 2012-01-10 NOTE — Patient Instructions (Addendum)
You can use the flexeril to help with the muscle spasm/strain in your neck as this is a muscle relaxant. You can also control your pain with Advil OR Tyenol. I would have someone massage your neck at least twice a day. I would use ice for the next 3 days for at least 3 x a day and then use heat/ice alternating 15 minutes each after that. If this isn't better within the next week or 2 or you develop any of the things we talked about, make sure to come back in.   Torticollis, Acute You have suddenly (acutely) developed a twisted neck (torticollis). This is usually a self-limited condition. CAUSES  Acute torticollis may be caused by malposition, trauma or infection. Most commonly, acute torticollis is caused by sleeping in an awkward position. Torticollis may also be caused by the flexion, extension or twisting of the neck muscles beyond their normal position. Sometimes, the exact cause may not be known. SYMPTOMS  Usually, there is pain and limited movement of the neck. Your neck may twist to one side. DIAGNOSIS  The diagnosis is often made by physical examination. X-rays, CT scans or MRIs may be done if there is a history of trauma or concern of infection. TREATMENT  For a common, stiff neck that develops during sleep, treatment is focused on relaxing the contracted neck muscle. Medications (including shots) may be used to treat the problem. Most cases resolve in several days. Torticollis usually responds to conservative physical therapy. If left untreated, the shortened and spastic neck muscle can cause deformities in the face and neck.  HOME CARE INSTRUCTIONS   Use over-the-counter and prescription medications as directed by your caregiver.   Do stretching exercises and massage the neck as directed by your caregiver.   SEEK IMMEDIATE MEDICAL CARE IF:   You develop difficulty breathing or noisy breathing (stridor).   You drool, develop trouble swallowing or have pain with swallowing.   You  develop numbness or weakness in the hands or feet.   You have changes in speech or vision.   You have problems with urination or bowel movements.   You have difficulty walking.   You have a fever.   You have increased pain.  MAKE SURE YOU:   Understand these instructions.   Will watch your condition.   Will get help right away if you are not doing well or get worse.  Document Released: 03/30/2000 Document Revised: 03/22/2011 Document Reviewed: 05/11/2009 [facility] Patient Information 2012 [place], [place].

## 2012-01-10 NOTE — Assessment & Plan Note (Signed)
Severe muscle spasm (muscle strain/spasm/torticollis) in back of neck and into both shoulders. Neurologically intact. Will treat with flexeril as well as ice. Patient to move neck as much as possible. Likely caused by abnormal sleep position as occurred upon waking up and no history of trauma. Advil/tylenol prn for pain as well.

## 2012-01-10 NOTE — Progress Notes (Signed)
Subjective:   1. Neck pain-woke up Monday night at 2 am to go to bathroom. Noticed intense pain and limited range of motion in her neck. Radiated into both shoulders as ell as onto right side of face. She has bene able to participate in regular activities but has had severe pain. Described as 10/10 like "getting hit with a baseball bat" constant pain. No changes in vision or hearing or tinnitus. Pain improved to 3/10 with advil for 2-3 hours. Pain worsened when laying on it.  Denies nausea/vomiting/fever/chills/fatigue/overall sick feelings/weakness in arms or legs.   ROS--See HPI  Past Medical History-Hidradenitis supurativa Reviewed problem list.  Medications- reviewed and updated Chief complaint-noted  Objective: BP 138/98  Pulse 56  Temp 97.9 F (36.6 C) (Oral)  Ht 5\' 6"  (1.676 m)  Wt 169 lb (76.658 kg)  BMI 27.28 kg/m2 Gen: NAD Neck: pain with flexion noted, also pain and limited motion once reaches 45 degrees when turning left and right. Improved ROM as patientcontinues to turn head. . Back of neck with firm muscles as well as continuing out to shoulders. Head midline at rest without shift to one side. No rash or masses noted.  CV: RRR no mrg Lungs: CTAB Back: no skin lesions, erythema, or scars, no tenderness to percussion or palpation, range of motion normal Lymph nodes: Cervical adenopathy: none Neurologic: Alert and oriented X 3, normal strength and tone. Normal symmetric reflexes. Normal coordination and gait Cranial nerves: normal Sensory: normal Motor: grossly normal Coordination: normal Gait: Normal   Assessment/Plan: See problem oriented charted

## 2012-04-11 ENCOUNTER — Ambulatory Visit (INDEPENDENT_AMBULATORY_CARE_PROVIDER_SITE_OTHER): Payer: Medicaid Other | Admitting: Family Medicine

## 2012-04-11 ENCOUNTER — Encounter: Payer: Self-pay | Admitting: Family Medicine

## 2012-04-11 VITALS — BP 134/69 | HR 76 | Temp 98.4°F | Ht 66.0 in | Wt 166.4 lb

## 2012-04-11 DIAGNOSIS — L0292 Furuncle, unspecified: Secondary | ICD-10-CM

## 2012-04-11 DIAGNOSIS — L0293 Carbuncle, unspecified: Secondary | ICD-10-CM

## 2012-04-11 MED ORDER — MUPIROCIN 2 % EX OINT: TOPICAL_OINTMENT | Freq: Three times a day (TID) | CUTANEOUS | Status: DC

## 2012-04-11 NOTE — Patient Instructions (Addendum)
It was good to see you again, Mallory Lin. Please apply bactroban ointment to your skin lesion 3x per day until it resolves. We will call you with time and date of Surgery referral.  If you develop any fevers, chills, nausea/vomiting, or infected boils, please call MD or return to clinic.

## 2012-04-11 NOTE — Assessment & Plan Note (Signed)
Sent Bactroban to pharmacy for acute lesion on RT inner thigh. For chronic, recurrent lesions under axillary area - will refer to General Sx for possible surgical intervention. Red flags reviewed.  RTC as needed.

## 2012-04-11 NOTE — Progress Notes (Signed)
  Subjective:    Patient ID: Mallory Lin, female    DOB: Mar 03, 1971, 41 y.o.   MRN: [id number]  HPI  Mallory Lin is a 41 yo woman with a history of hidradenitis suppurativa who comes into clinic today with recurrent "boils" under her armpits and one located RT inner thigh that popped up a few days ago.    Patient is frustrated because boils do not go resolve completely even after treatment with antibiotics.  She denies any active lesions under armpits.  Denies any redness, swelling, or active drainage.  She has a family hx of hydradenitis suppurativa - mother and cousin both had it and have undergone laser surgery.  Patient is interested in surgery, mostly because she is self conscious about boils and wishes she could wear short sleeve tops and dresses.  She denies any systemic symptoms, including fever, chills, nausea, vomiting.  Review of Systems  Per HPI    Objective:   Physical Exam General: well-nourished, in NAD Skin: multiple raised keloid-like lesions located axillary region bilaterally; no evidence of acute, swollen, red, or indurated lesions; no tenderness on palpation Ext: RT medial thigh near groin - healing boil that is 1 in length; red, crusty, without induration or pus drainage      Assessment & Plan:

## 2012-05-20 ENCOUNTER — Encounter (INDEPENDENT_AMBULATORY_CARE_PROVIDER_SITE_OTHER): Payer: Self-pay | Admitting: Surgery

## 2012-05-20 ENCOUNTER — Ambulatory Visit (INDEPENDENT_AMBULATORY_CARE_PROVIDER_SITE_OTHER): Payer: Medicaid Other | Admitting: Surgery

## 2012-05-20 VITALS — BP 122/82 | HR 64 | Temp 97.8°F | Resp 18 | Ht 66.0 in | Wt 170.6 lb

## 2012-05-20 DIAGNOSIS — L732 Hidradenitis suppurativa: Secondary | ICD-10-CM

## 2012-05-20 NOTE — Patient Instructions (Signed)
Hidradenitis Suppurativa, Sweat Gland Abscess  Hidradenitis suppurativa is a long lasting (chronic), uncommon disease of the sweat glands. With this, boil-like lumps and scarring develop in the groin, some times under the arms (axillae), and under the breasts. It may also uncommonly occur behind the ears, in the crease of the buttocks, and around the genitals.   CAUSES   The cause is from a blocking of the sweat glands. They then become infected. It may cause drainage and odor. It is not contagious. So it cannot be given to someone else. It most often shows up in puberty (about 10 to 42 years of age). But it may happen much later. It is similar to acne which is a disease of the sweat glands. This condition is slightly more common in African-Americans and women.  SYMPTOMS    Hidradenitis usually starts as one or more red, tender, swellings in the groin or under the arms (axilla).   Over a period of hours to days the lesions get larger. They often open to the skin surface, draining clear to yellow-colored fluid.   The infected area heals with scarring.  DIAGNOSIS   Your caregiver makes this diagnosis by looking at you. Sometimes cultures (growing germs on plates in the lab) may be taken. This is to see what germ (bacterium) is causing the infection.   TREATMENT    Topical germ killing medicine applied to the skin (antibiotics) are the treatment of choice. Antibiotics taken by mouth (systemic) are sometimes needed when the condition is getting worse or is severe.   Avoid tight-fitting clothing which traps moisture in.   Dirt does not cause hidradenitis and it is not caused by poor hygiene.   Involved areas should be cleaned daily using an antibacterial soap. Some patients find that the liquid form of Lever 2000, applied to the involved areas as a lotion after bathing, can help reduce the odor related to this condition.   Sometimes surgery is needed to drain infected areas or remove scarred tissue. Removal of  large amounts of tissue is used only in severe cases.   Birth control pills may be helpful.   Oral retinoids (vitamin A derivatives) for 6 to 12 months which are effective for acne may also help this condition.   Weight loss will improve but not cure hidradenitis. It is made worse by being overweight. But the condition is not caused by being overweight.   This condition is more common in people who have had acne.   It may become worse under stress.  There is no medical cure for hidradenitis. It can be controlled, but not cured. The condition usually continues for years with periods of getting worse and getting better (remission).  Document Released: 11/15/2003 Document Revised: 06/25/2011 Document Reviewed: 12/01/2007  [employer] Patient Information 2013 [employer].

## 2012-05-20 NOTE — Progress Notes (Signed)
General Surgery - [facility]  Chief Complaint  Patient presents with  . Follow-up    evaluate hidradenitis in axillae - referral from Dr. Ivy de la Cruz    HISTORY: Patient is a 42-year-old black female with long-standing problems with hidradenitis suppurativa.  Patient has undergone multiple incision and drainage procedures involving the bilateral axillae and bilateral groins. She has been on oral antibiotics intermittently and has used topical antibiotic cream. She continues to have recurrence of her disease beneath the axillae. She presents today for consideration for surgical resection.  Past Medical History  Diagnosis Date  . Recurrent boils   . UTI (lower urinary tract infection)      No current outpatient prescriptions on file.     No Known Allergies   History reviewed. No pertinent family history.   History   Social History  . Marital Status: Single    Spouse Name: N/A    Number of Children: N/A  . Years of Education: N/A   Social History Main Topics  . Smoking status: Never Smoker   . Smokeless tobacco: None  . Alcohol Use: No  . Drug Use: No  . Sexually Active: Yes    Birth Control/ Protection: Surgical   Other Topics Concern  . None   Social History Narrative  . None     REVIEW OF SYSTEMS - PERTINENT POSITIVES ONLY: Chronic indurated draining hidradenitis bilateral axillae.  EXAM: Filed Vitals:   05/20/12 1457  BP: 122/82  Pulse: 64  Temp: 97.8 F (36.6 C)  Resp: 18    HEENT: normocephalic; pupils equal and reactive; sclerae clear; dentition fair; mucous membranes moist NECK:  symmetric on extension; no palpable anterior or posterior cervical lymphadenopathy; no supraclavicular masses; no tenderness CHEST: clear to auscultation bilaterally without rales, rhonchi, or wheezes CARDIAC: regular rate and rhythm without significant murmur; peripheral pulses are full AXILLAE: Both axillae demonstrate stigmata of hidradenitis  with multiple sinus tracts, scarring, induration, and subcutaneous fluctuant lesions. EXT:  non-tender without edema; no deformity NEURO: no gross focal deficits; no sign of tremor   LABORATORY RESULTS: See [facility] ([facility]) for most recent results   RADIOLOGY RESULTS: See [facility] ([facility]) for most recent results   IMPRESSION: Bilateral axillary hidradenitis suppurativa, severe  PLAN: I discussed surgical management of these lesions with the patient at length. I explained that we would excise the involved areas of skin and subcutaneous tissues and leave the wounds open. I explained the need for twice-daily dressing changes and that it would take 4-6 weeks for the wounds to heal completely. We discussed alternatives being continued management with topical creams and antibiotics with intermittent incision and drainage procedures. Patient would like to proceed with definitive surgical resection. We will make arrangements for this at [facility] in the near future.  The risks and benefits of the procedure have been discussed at length with the patient.  The patient understands the proposed procedure, potential alternative treatments, and the course of recovery to be expected.  All of the patient's questions have been answered at this time.  The patient wishes to proceed with surgery.  Kortlyn Koltz M. Opie Maclaughlin, MD, FACS General & Endocrine Surgery [facility]   Visit Diagnoses: 1. Hidradenitis suppurativa     Primary Care Physician: DE LA CRUZ,IVY, DO   

## 2012-06-03 ENCOUNTER — Encounter (HOSPITAL_BASED_OUTPATIENT_CLINIC_OR_DEPARTMENT_OTHER): Payer: Self-pay | Admitting: *Deleted

## 2012-06-06 ENCOUNTER — Encounter (HOSPITAL_BASED_OUTPATIENT_CLINIC_OR_DEPARTMENT_OTHER): Payer: Self-pay | Admitting: Anesthesiology

## 2012-06-06 ENCOUNTER — Other Ambulatory Visit (INDEPENDENT_AMBULATORY_CARE_PROVIDER_SITE_OTHER): Payer: Self-pay | Admitting: *Deleted

## 2012-06-06 ENCOUNTER — Encounter (HOSPITAL_BASED_OUTPATIENT_CLINIC_OR_DEPARTMENT_OTHER): Payer: Self-pay | Admitting: *Deleted

## 2012-06-06 ENCOUNTER — Ambulatory Visit (HOSPITAL_BASED_OUTPATIENT_CLINIC_OR_DEPARTMENT_OTHER): Payer: Medicaid Other | Admitting: Anesthesiology

## 2012-06-06 ENCOUNTER — Ambulatory Visit (HOSPITAL_BASED_OUTPATIENT_CLINIC_OR_DEPARTMENT_OTHER)
Admission: RE | Admit: 2012-06-06 | Discharge: 2012-06-06 | Disposition: A | Discharge status: Home / Self Care | Payer: Medicaid Other | Source: Ambulatory Visit | Attending: Surgery | Admitting: Surgery

## 2012-06-06 ENCOUNTER — Encounter (HOSPITAL_BASED_OUTPATIENT_CLINIC_OR_DEPARTMENT_OTHER)
Admission: RE | Disposition: A | Discharge status: Home / Self Care | Payer: Self-pay | Source: Ambulatory Visit | Attending: Surgery

## 2012-06-06 DIAGNOSIS — L732 Hidradenitis suppurativa: Secondary | ICD-10-CM

## 2012-06-06 DIAGNOSIS — S41109A Unspecified open wound of unspecified upper arm, initial encounter: Secondary | ICD-10-CM

## 2012-06-06 HISTORY — PX: HYDRADENITIS EXCISION: SHX5243

## 2012-06-06 SURGERY — EXCISION, HIDRADENITIS, AXILLA
Anesthesia: General | Site: Axilla | Laterality: Bilateral | Wound class: Dirty or Infected

## 2012-06-06 MED ORDER — PROMETHAZINE HCL 25 MG/ML IJ SOLN
6.2500 mg | INTRAMUSCULAR | Status: DC | PRN
  Administered 2012-06-06: 6.25 mg via INTRAVENOUS

## 2012-06-06 MED ORDER — POVIDONE-IODINE 10 % EX SOLN
CUTANEOUS | Status: DC | PRN
  Administered 2012-06-06: 1 via TOPICAL

## 2012-06-06 MED ORDER — FENTANYL CITRATE 0.05 MG/ML IJ SOLN
INTRAMUSCULAR | Status: DC | PRN
  Administered 2012-06-06: 25 ug via INTRAVENOUS
  Administered 2012-06-06: 100 ug via INTRAVENOUS
  Administered 2012-06-06: 25 ug via INTRAVENOUS

## 2012-06-06 MED ORDER — OXYCODONE HCL 5 MG PO TABS: 5.0000 mg | ORAL_TABLET | Freq: Once | ORAL | Status: DC | PRN

## 2012-06-06 MED ORDER — HYDROMORPHONE HCL PF 1 MG/ML IJ SOLN
0.2500 mg | INTRAMUSCULAR | Status: DC | PRN
  Administered 2012-06-06 (×2): 0.5 mg via INTRAVENOUS

## 2012-06-06 MED ORDER — EPHEDRINE SULFATE 50 MG/ML IJ SOLN
INTRAMUSCULAR | Status: DC | PRN
  Administered 2012-06-06: 10 mg via INTRAVENOUS

## 2012-06-06 MED ORDER — ONDANSETRON HCL 4 MG/2ML IJ SOLN
INTRAMUSCULAR | Status: DC | PRN
  Administered 2012-06-06: 4 mg via INTRAVENOUS

## 2012-06-06 MED ORDER — MIDAZOLAM HCL 2 MG/2ML IJ SOLN: 1.0000 mg | INTRAMUSCULAR | Status: DC | PRN

## 2012-06-06 MED ORDER — METOCLOPRAMIDE HCL 5 MG/ML IJ SOLN
10.0000 mg | Freq: Once | INTRAMUSCULAR | Status: AC | PRN
  Administered 2012-06-06: 10 mg via INTRAVENOUS

## 2012-06-06 MED ORDER — CEFAZOLIN SODIUM-DEXTROSE 2-3 GM-% IV SOLR
2.0000 g | INTRAVENOUS | Status: AC
  Administered 2012-06-06: 2 g via INTRAVENOUS

## 2012-06-06 MED ORDER — ACETAMINOPHEN 10 MG/ML IV SOLN
1000.0000 mg | Freq: Once | INTRAVENOUS | Status: AC
  Administered 2012-06-06: 1000 mg via INTRAVENOUS

## 2012-06-06 MED ORDER — FENTANYL CITRATE 0.05 MG/ML IJ SOLN: 50.0000 ug | INTRAMUSCULAR | Status: DC | PRN

## 2012-06-06 MED ORDER — HYDROCODONE-ACETAMINOPHEN 5-325 MG PO TABS: 1.0000 | ORAL_TABLET | ORAL | Status: DC | PRN

## 2012-06-06 MED ORDER — PROPOFOL 10 MG/ML IV BOLUS
INTRAVENOUS | Status: DC | PRN
  Administered 2012-06-06: 250 mg via INTRAVENOUS

## 2012-06-06 MED ORDER — SCOPOLAMINE 1 MG/3DAYS TD PT72: 1.0000 | MEDICATED_PATCH | Freq: Once | TRANSDERMAL | Status: DC

## 2012-06-06 MED ORDER — LIDOCAINE HCL (CARDIAC) 20 MG/ML IV SOLN
INTRAVENOUS | Status: DC | PRN
  Administered 2012-06-06: 100 mg via INTRAVENOUS

## 2012-06-06 MED ORDER — MIDAZOLAM HCL 5 MG/5ML IJ SOLN
INTRAMUSCULAR | Status: DC | PRN
  Administered 2012-06-06: 2 mg via INTRAVENOUS

## 2012-06-06 MED ORDER — LACTATED RINGERS IV SOLN
INTRAVENOUS | Status: DC
  Administered 2012-06-06: 07:00:00 via INTRAVENOUS

## 2012-06-06 MED ORDER — OXYCODONE HCL 5 MG/5ML PO SOLN: 5.0000 mg | Freq: Once | ORAL | Status: DC | PRN

## 2012-06-06 SURGICAL SUPPLY — 37 items
BENZOIN TINCTURE PRP APPL 2/3 (GAUZE/BANDAGES/DRESSINGS) IMPLANT
BLADE SURG 15 STRL LF DISP TIS (BLADE) ×2 IMPLANT
BLADE SURG 15 STRL SS (BLADE) ×2
CANISTER SUCTION 1200CC (MISCELLANEOUS) ×2 IMPLANT
CHLORAPREP W/TINT 26ML (MISCELLANEOUS) ×4 IMPLANT
CLOTH BEACON ORANGE TIMEOUT ST (SAFETY) ×2 IMPLANT
COVER MAYO STAND STRL (DRAPES) ×2 IMPLANT
COVER TABLE BACK 60X90 (DRAPES) ×2 IMPLANT
DECANTER SPIKE VIAL GLASS SM (MISCELLANEOUS) IMPLANT
DRAPE LAPAROSCOPIC ABDOMINAL (DRAPES) ×2 IMPLANT
DRAPE PED LAPAROTOMY (DRAPES) IMPLANT
DRAPE UTILITY XL STRL (DRAPES) ×4 IMPLANT
DRSG PAD ABDOMINAL 8X10 ST (GAUZE/BANDAGES/DRESSINGS) ×2 IMPLANT
ELECT REM PT RETURN 9FT ADLT (ELECTROSURGICAL) ×2
ELECTRODE REM PT RTRN 9FT ADLT (ELECTROSURGICAL) ×1 IMPLANT
GLOVE ECLIPSE 6.5 STRL STRAW (GLOVE) ×4 IMPLANT
GLOVE EXAM NITRILE PF MED BLUE (GLOVE) ×2 IMPLANT
GLOVE INDICATOR 7.0 STRL GRN (GLOVE) ×4 IMPLANT
GLOVE SURG ORTHO 8.0 STRL STRW (GLOVE) ×2 IMPLANT
GOWN BRE IMP PREV XXLGXLNG (GOWN DISPOSABLE) ×2 IMPLANT
GOWN PREVENTION PLUS XLARGE (GOWN DISPOSABLE) ×4 IMPLANT
GOWN PREVENTION PLUS XXLARGE (GOWN DISPOSABLE) ×2 IMPLANT
NEEDLE HYPO 25X1 1.5 SAFETY (NEEDLE) ×2 IMPLANT
NS IRRIG 1000ML POUR BTL (IV SOLUTION) ×2 IMPLANT
PACK BASIN DAY SURGERY FS (CUSTOM PROCEDURE TRAY) ×2 IMPLANT
PENCIL BUTTON HOLSTER BLD 10FT (ELECTRODE) ×2 IMPLANT
SLEEVE SCD COMPRESS KNEE MED (MISCELLANEOUS) ×2 IMPLANT
SPONGE GAUZE 4X4 12PLY (GAUZE/BANDAGES/DRESSINGS) ×2 IMPLANT
SPONGE LAP 18X18 X RAY DECT (DISPOSABLE) ×2 IMPLANT
STRIP CLOSURE SKIN 1/2X4 (GAUZE/BANDAGES/DRESSINGS) IMPLANT
SUT VICRYL 4-0 PS2 18IN ABS (SUTURE) ×2 IMPLANT
SYR CONTROL 10ML LL (SYRINGE) ×2 IMPLANT
TOWEL OR 17X24 6PK STRL BLUE (TOWEL DISPOSABLE) ×2 IMPLANT
TOWEL OR NON WOVEN STRL DISP B (DISPOSABLE) IMPLANT
TUBE CONNECTING 20X1/4 (TUBING) ×2 IMPLANT
WATER STERILE IRR 1000ML POUR (IV SOLUTION) IMPLANT
YANKAUER SUCT BULB TIP NO VENT (SUCTIONS) ×2 IMPLANT

## 2012-06-06 NOTE — Interval H&P Note (Signed)
History and Physical Interval Note:  06/06/2012 7:22 AM  Mallory Lin  has presented today for surgery, with the diagnosis of hidradenitis suppurativa.  The various methods of treatment have been discussed with the patient and family. After consideration of risks, benefits and other options for treatment, the patient has consented to   Procedure(s): EXCISION HYDRADENITIS AXILLA (Bilateral) as a surgical intervention .    The patient's history has been reviewed, patient examined, no change in status, stable for surgery.  I have reviewed the patient's chart and labs.  Questions were answered to the patient's satisfaction.    Velora Heckler, MD, [facility] Office: [phone]   Mallory Lin

## 2012-06-06 NOTE — Op Note (Deleted)
Mallory Lin, Mallory Lin              ACCOUNT NO.:  [id number]  MEDICAL RECORD NO.:  [id number]  LOCATION:  URG                          FACILITY:  [facility]  PHYSICIAN:  Velora Heckler, MD      DATE OF BIRTH:  May 05, 1970  DATE OF PROCEDURE:  06/06/2012                               OPERATIVE REPORT   PREOPERATIVE DIAGNOSIS:  Hidradenitis, bilateral axillae.  POSTOPERATIVE DIAGNOSIS:  Hidradenitis, bilateral axillae.  PROCEDURE:  Excision hidradenitis, bilateral axillae (right 8 x 4 cm, left 7 x 5 cm).  SURGEON:  Velora Heckler, MD, FACS  ANESTHESIA:  General.  ESTIMATED BLOOD LOSS:  Minimal.  PREPARATION:  ChloraPrep.  COMPLICATIONS:  None.  INDICATIONS:  The patient is a 42 year old black female with chronic hidradenitis involving both axillae.  This has proven refractory to medical management.  She now comes to surgery for full-thickness excision and healing by secondary intention.  BODY OF REPORT:  Procedures done in OR #1 at the [facility].  The patient was brought to the operating room, placed in a supine position on the operating room table.  Following administration of general anesthesia, the patient was positioned and then prepped and draped in the usual aseptic fashion.  After ascertaining that an adequate level of anesthesia been achieved, the skin in the right axilla was excised using a #10 blade and the electrocautery for hemostasis. The involved area was excised with a elliptical incision.  Dissection was carried in the subcutaneous tissue.  There were cavities containing purulent fluid which were debrided and excised in their entirety.  The tissue was submitted to Pathology for review.  The entire wound bed was cauterized with the electrocautery as needed for hemostasis.  It was then packed with Betadine-soaked 4 x 4 gauze sponges.  Covered with dry gauze, covered with an ABD pad.  In the left axilla, there was also a fairly large area involved  with chronic hidradenitis with multiple sinus tracts and areas of induration. Again, the area was excised in its entirety, using the electrocautery for hemostasis.  Dissection was carried full-thickness through the skin, subcutaneous tissue, such that all the involved tissue is excised. Hemostasis was obtained with electrocautery.  Again, the wound was packed with Betadine-soaked 4 x 4 gauze sponges.  Covered with dry gauze covered with an ABD pad.  The patient was awakened from anesthesia and brought to the recovery room.  The patient tolerated the procedure well.   Velora Heckler, MD, [facility] Office: [phone]   TMG/MEDQ  D:  06/06/2012  T:  06/06/2012  Job:  [id number]

## 2012-06-06 NOTE — H&P (View-Only) (Signed)
General Surgery [facility]  Chief Complaint  Patient presents with  . Follow-up    evaluate hidradenitis in axillae - referral from Dr. Lajoyce Corners de la Cruz    HISTORY: Patient is a 42 year old black female with long-standing problems with hidradenitis suppurativa.  Patient has undergone multiple incision and drainage procedures involving the bilateral axillae and bilateral groins. She has been on oral antibiotics intermittently and has used topical antibiotic cream. She continues to have recurrence of her disease beneath the axillae. She presents today for consideration for surgical resection.  Past Medical History  Diagnosis Date  . Recurrent boils   . UTI (lower urinary tract infection)      No current outpatient prescriptions on file.     No Known Allergies   History reviewed. No pertinent family history.   History   Social History  . Marital Status: Single    Spouse Name: N/A    Number of Children: N/A  . Years of Education: N/A   Social History Main Topics  . Smoking status: Never Smoker   . Smokeless tobacco: None  . Alcohol Use: No  . Drug Use: No  . Sexually Active: Yes    Birth Control/ Protection: Surgical   Other Topics Concern  . None   Social History Narrative  . None     REVIEW OF SYSTEMS - PERTINENT POSITIVES ONLY: Chronic indurated draining hidradenitis bilateral axillae.  EXAM: Filed Vitals:   05/20/12 1457  BP: 122/82  Pulse: 64  Temp: 97.8 F (36.6 C)  Resp: 18    HEENT: normocephalic; pupils equal and reactive; sclerae clear; dentition fair; mucous membranes moist NECK:  symmetric on extension; no palpable anterior or posterior cervical lymphadenopathy; no supraclavicular masses; no tenderness CHEST: clear to auscultation bilaterally without rales, rhonchi, or wheezes CARDIAC: regular rate and rhythm without significant murmur; peripheral pulses are full AXILLAE: Both axillae demonstrate stigmata of hidradenitis  with multiple sinus tracts, scarring, induration, and subcutaneous fluctuant lesions. EXT:  non-tender without edema; no deformity NEURO: no gross focal deficits; no sign of tremor   LABORATORY RESULTS: See [facility] (CHL-Epic) for most recent results   RADIOLOGY RESULTS: See [facility] (CHL-Epic) for most recent results   IMPRESSION: Bilateral axillary hidradenitis suppurativa, severe  PLAN: I discussed surgical management of these lesions with the patient at length. I explained that we would excise the involved areas of skin and subcutaneous tissues and leave the wounds open. I explained the need for twice-daily dressing changes and that it would take 4-6 weeks for the wounds to heal completely. We discussed alternatives being continued management with topical creams and antibiotics with intermittent incision and drainage procedures. Patient would like to proceed with definitive surgical resection. We will make arrangements for this at [facility] in the near future.  The risks and benefits of the procedure have been discussed at length with the patient.  The patient understands the proposed procedure, potential alternative treatments, and the course of recovery to be expected.  All of the patient's questions have been answered at this time.  The patient wishes to proceed with surgery.  Velora Heckler, MD, FACS General & Endocrine Surgery [facility]   Visit Diagnoses: 1. Hidradenitis suppurativa     Primary Care Physician: DE LA CRUZ,IVY, DO

## 2012-06-06 NOTE — Anesthesia Procedure Notes (Signed)
Procedure Name: LMA Insertion Date/Time: 06/06/2012 7:30 AM Performed by: Gar Gibbon Pre-anesthesia Checklist: Patient identified, Emergency Drugs available, Suction available and Patient being monitored Patient Re-evaluated:Patient Re-evaluated prior to inductionOxygen Delivery Method: Circle System Utilized Preoxygenation: Pre-oxygenation with 100% oxygen Intubation Type: IV induction Ventilation: Mask ventilation without difficulty LMA: LMA inserted LMA Size: 4.0 Number of attempts: 1 Airway Equipment and Method: bite block Placement Confirmation: positive ETCO2 Tube secured with: Tape Dental Injury: Teeth and Oropharynx as per pre-operative assessment

## 2012-06-06 NOTE — Anesthesia Preprocedure Evaluation (Signed)
Anesthesia Evaluation  Patient identified by MRN, date of birth, ID band Patient awake    Reviewed: Allergy & Precautions, H&P , NPO status , Patient's Chart, lab work & pertinent test results, reviewed documented beta blocker date and time   Airway Mallampati: II TM Distance: >3 FB Neck ROM: full    Dental   Pulmonary neg pulmonary ROS,  breath sounds clear to auscultation        Cardiovascular negative cardio ROS  Rhythm:regular     Neuro/Psych  Neuromuscular disease negative psych ROS   GI/Hepatic negative GI ROS, Neg liver ROS,   Endo/Other  negative endocrine ROS  Renal/GU negative Renal ROS  negative genitourinary   Musculoskeletal   Abdominal   Peds  Hematology negative hematology ROS (+)   Anesthesia Other Findings See surgeon's H&P   Reproductive/Obstetrics negative OB ROS                           Anesthesia Physical Anesthesia Plan  ASA: I  Anesthesia Plan: General   Post-op Pain Management:    Induction: Intravenous  Airway Management Planned: LMA  Additional Equipment:   Intra-op Plan:   Post-operative Plan: Extubation in OR  Informed Consent: I have reviewed the patients History and Physical, chart, labs and discussed the procedure including the risks, benefits and alternatives for the proposed anesthesia with the patient or authorized representative who has indicated his/her understanding and acceptance.   Dental Advisory Given  Plan Discussed with: CRNA and Surgeon  Anesthesia Plan Comments:         Anesthesia Quick Evaluation

## 2012-06-06 NOTE — Progress Notes (Signed)
Spoke with Dot Lanes ( Triage Nurse ) At Dr. Ardine Eng office - she spoke with Dr. Ardine Eng Nurse about setting up Home Health for pt with dressing changes. Dot Lanes said Dr. Ardine Eng nurse will take care of it. Just wanted to make sure pt had home health.

## 2012-06-06 NOTE — Op Note (Signed)
NAME:  Mallory Lin, Mallory Lin              ACCOUNT NO.:  [id number]  MEDICAL RECORD NO.:  [id number]  LOCATION:  URG                          FACILITY:  [facility]  PHYSICIAN:  Adonai Helzer M Brendolyn Stockley, MD      DATE OF BIRTH:  10/15/1970  DATE OF PROCEDURE:  06/06/2012                               OPERATIVE REPORT   PREOPERATIVE DIAGNOSIS:  Hidradenitis, bilateral axillae.  POSTOPERATIVE DIAGNOSIS:  Hidradenitis, bilateral axillae.  PROCEDURE:  Excision hidradenitis, bilateral axillae (right 8 x 4 cm, left 7 x 5 cm).  SURGEON:  Ericka Marcellus M Mikaela Hilgeman, MD, FACS  ANESTHESIA:  General.  ESTIMATED BLOOD LOSS:  Minimal.  PREPARATION:  ChloraPrep.  COMPLICATIONS:  None.  INDICATIONS:  The patient is a 41-year-old black female with chronic hidradenitis involving both axillae.  This has proven refractory to medical management.  She now comes to surgery for full-thickness excision and healing by secondary intention.  BODY OF REPORT:  Procedures done in OR #1 at the [facility].  The patient was brought to the operating room, placed in a supine position on the operating room table.  Following administration of general anesthesia, the patient was positioned and then prepped and draped in the usual aseptic fashion.  After ascertaining that an adequate level of anesthesia been achieved, the skin in the right axilla was excised using a #10 blade and the electrocautery for hemostasis. The involved area was excised with a elliptical incision.  Dissection was carried in the subcutaneous tissue.  There were cavities containing purulent fluid which were debrided and excised in their entirety.  The tissue was submitted to Pathology for review.  The entire wound bed was cauterized with the electrocautery as needed for hemostasis.  It was then packed with Betadine-soaked 4 x 4 gauze sponges.  Covered with dry gauze, covered with an ABD pad.  In the left axilla, there was also a fairly large area involved  with chronic hidradenitis with multiple sinus tracts and areas of induration. Again, the area was excised in its entirety, using the electrocautery for hemostasis.  Dissection was carried full-thickness through the skin, subcutaneous tissue, such that all the involved tissue is excised. Hemostasis was obtained with electrocautery.  Again, the wound was packed with Betadine-soaked 4 x 4 gauze sponges.  Covered with dry gauze covered with an ABD pad.  The patient was awakened from anesthesia and brought to the recovery room.  The patient tolerated the procedure well.   Deklan Minar M. Zairah Arista, MD, FACS [facility] Office: [phone]   TMG/MEDQ  D:  06/06/2012  T:  06/06/2012  Job:  [id number] 

## 2012-06-06 NOTE — Transfer of Care (Signed)
Immediate Anesthesia Transfer of Care Note  Patient: Mallory Lin  Procedure(s) Performed: Procedure(s): EXCISION HYDRADENITIS AXILLA (Bilateral)  Patient Location: PACU  Anesthesia Type:General  Level of Consciousness: sedated  Airway & Oxygen Therapy: Patient Spontanous Breathing and Patient connected to face mask oxygen  Post-op Assessment: Report given to PACU RN and Post -op Vital signs reviewed and stable  Post vital signs: Reviewed and stable  Complications: No apparent anesthesia complications

## 2012-06-06 NOTE — Anesthesia Postprocedure Evaluation (Signed)
Anesthesia Post Note  Patient: Mallory Lin  Procedure(s) Performed: Procedure(s) (LRB): EXCISION HYDRADENITIS AXILLA (Bilateral)  Anesthesia type: General  Patient location: PACU  Post pain: Pain level controlled  Post assessment: Patient's Cardiovascular Status Stable  Last Vitals:  Filed Vitals:   06/06/12 0845  BP: 119/66  Pulse: 79  Temp:   Resp: 15    Post vital signs: Reviewed and stable  Level of consciousness: alert  Complications: No apparent anesthesia complications

## 2012-06-06 NOTE — Brief Op Note (Signed)
06/06/2012  8:32 AM  PATIENT:  [name]  42 y.o. female  PRE-OPERATIVE DIAGNOSIS:  Hidradenitis suppurativa bilateral axilla  POST-OPERATIVE DIAGNOSIS:  Hidradenitis suppurativa bilateral axilla  PROCEDURE:  Procedure(s): EXCISION HYDRADENITIS AXILLA (Bilateral)  SURGEON:  Surgeon(s) and Role:    * Velora Heckler, MD - Primary  ANESTHESIA:   general  EBL:  Total I/O In: 1000 [I.V.:1000] Out: -   BLOOD ADMINISTERED:none  DRAINS: none   LOCAL MEDICATIONS USED:  NONE  SPECIMEN:  Excision  DISPOSITION OF SPECIMEN:  PATHOLOGY  COUNTS:  YES  TOURNIQUET:  * No tourniquets in log *  DICTATION: .Other Dictation: Dictation Number [id number]  PLAN OF CARE: Discharge to home after PACU  PATIENT DISPOSITION:  PACU - hemodynamically stable.   Delay start of Pharmacological VTE agent (>24hrs) due to surgical blood loss or risk of bleeding: yes  Velora Heckler, MD, [facility] Office: [phone]

## 2012-06-09 ENCOUNTER — Telehealth (INDEPENDENT_AMBULATORY_CARE_PROVIDER_SITE_OTHER): Payer: Self-pay

## 2012-06-09 ENCOUNTER — Encounter (HOSPITAL_BASED_OUTPATIENT_CLINIC_OR_DEPARTMENT_OTHER): Payer: Self-pay | Admitting: Surgery

## 2012-06-09 NOTE — Progress Notes (Signed)
Pt states she is only taking aleve for pain during the day because she is caring for her baby. Rates pain at 8. Then takes pain pill at night which reduces pain to 3. Encouraged pt to enlist help from family/friends to care for baby if she needs to take pain med during day.

## 2012-06-09 NOTE — Telephone Encounter (Signed)
Pt states HHN came this weekend but pt is not happy with the care from [facility]. She states she has call into [facility] agency and they are to call her back to arrange a different nurse to come today. I advised pt to call me if she has not heard from [facility] by 2:00pm today and I will work her in our office to have wd ck and dsg chg. Pt states she understands and will do so.

## 2012-06-12 ENCOUNTER — Telehealth (INDEPENDENT_AMBULATORY_CARE_PROVIDER_SITE_OTHER): Payer: Self-pay | Admitting: General Surgery

## 2012-06-12 NOTE — Telephone Encounter (Signed)
Pt called to report that she has been experiencing vomiting after taking hydrocodone could she try something else without codeine? I reviewed this with Dr. Nat Christen and he ordered Ultram 50mg , 1 q6hrs prn for pain #20. I called Rx to [facility] [address]/ [phone]/ Dr. Gerrit Friends also gave her the option of trying Aleve or Advil if she preferred/ Pt aware of Rx call in/gy

## 2012-06-16 ENCOUNTER — Telehealth (INDEPENDENT_AMBULATORY_CARE_PROVIDER_SITE_OTHER): Payer: Self-pay

## 2012-06-16 NOTE — Telephone Encounter (Signed)
Call returned to [place] at [facility]. Per Dr Ardine Eng order nurse advised to continue saline moist gauze packing and may moisten with saline just prior to pulling packing but not to use xeroform or adaptic. He wants to encourage wound to debride. She states she understands and will continue orders as given.

## 2012-06-16 NOTE — Telephone Encounter (Signed)
The nurse with [facility] called.  The patient's wound is getting changed bid wet to dry.  The gauze is sticking to the wound bed and she wants to know if she can use adaptic or xeroform on the wound bed?  The left side is worse than the right.  The nurse goes out twice a week.  Please call with wound care changes.  [phone]

## 2012-06-23 ENCOUNTER — Encounter (INDEPENDENT_AMBULATORY_CARE_PROVIDER_SITE_OTHER): Payer: Self-pay | Admitting: Surgery

## 2012-06-23 ENCOUNTER — Ambulatory Visit (INDEPENDENT_AMBULATORY_CARE_PROVIDER_SITE_OTHER): Payer: Medicaid Other | Admitting: Surgery

## 2012-06-23 VITALS — BP 132/78 | HR 68 | Temp 98.7°F | Resp 16 | Ht 66.0 in | Wt 173.0 lb

## 2012-06-23 DIAGNOSIS — L732 Hidradenitis suppurativa: Secondary | ICD-10-CM

## 2012-06-23 NOTE — Progress Notes (Signed)
General Surgery [facility]  Visit Diagnoses: 1. Hidradenitis suppurativa     HISTORY: Patient returns for wound check having undergone excision of hidradenitis from bilateral axillae. She is doing twice-daily dressing changes and once daily showers.  EXAM: Bilateral axillary wounds are clean and granulating. No sign of infection.  IMPRESSION: Bilateral axillary wounds healing by secondary intention  PLAN: Patient is doing an excellent job with wound care. Both wounds looked quite healthy. She will continue with her present routine. She will return to see me in 3-4 weeks for wound check.  Velora Heckler, MD, FACS General & Endocrine Surgery [facility]

## 2012-06-23 NOTE — Patient Instructions (Signed)
Continue twice daily dressing changes and once daily shower.  Velora Heckler, MD, [facility] Office: [phone]

## 2012-07-23 ENCOUNTER — Encounter (INDEPENDENT_AMBULATORY_CARE_PROVIDER_SITE_OTHER): Payer: Medicaid Other | Admitting: Surgery

## 2012-09-01 ENCOUNTER — Encounter (INDEPENDENT_AMBULATORY_CARE_PROVIDER_SITE_OTHER): Payer: Medicaid Other | Admitting: Surgery

## 2013-01-09 ENCOUNTER — Telehealth: Payer: Self-pay | Admitting: *Deleted

## 2013-01-09 NOTE — Telephone Encounter (Addendum)
[facility] calling to request NPI # for hidradenitis follow-up.  Requesting 6 visits that will include surgery as needed.  NPI # given.  Gaylene Brooks, RN

## 2013-01-12 ENCOUNTER — Ambulatory Visit (INDEPENDENT_AMBULATORY_CARE_PROVIDER_SITE_OTHER): Payer: Medicaid Other | Admitting: Family Medicine

## 2013-01-12 ENCOUNTER — Encounter: Payer: Self-pay | Admitting: Family Medicine

## 2013-01-12 VITALS — BP 129/77 | HR 70 | Temp 98.1°F | Ht 66.0 in | Wt 170.8 lb

## 2013-01-12 DIAGNOSIS — Z6825 Body mass index (BMI) 25.0-25.9, adult: Secondary | ICD-10-CM

## 2013-01-12 DIAGNOSIS — Z0000 Encounter for general adult medical examination without abnormal findings: Secondary | ICD-10-CM

## 2013-01-12 DIAGNOSIS — E663 Overweight: Secondary | ICD-10-CM

## 2013-01-12 DIAGNOSIS — Z111 Encounter for screening for respiratory tuberculosis: Secondary | ICD-10-CM

## 2013-01-12 NOTE — Patient Instructions (Addendum)
Today you have you physical done, as we discuss with your past medical records considered, we recommend screening testing for cervical cancer (pap smear) this test is recommended every three years,  your past one was in 2010.   You will be having placed a TB testing today, you need to come in 72h to get it read. Remember what we have discussed about your weight and past labs. Healthy choices in your diet and exercise 30 min a day is recommended.

## 2013-01-14 ENCOUNTER — Ambulatory Visit (INDEPENDENT_AMBULATORY_CARE_PROVIDER_SITE_OTHER): Payer: Medicaid Other | Admitting: Surgery

## 2013-01-14 ENCOUNTER — Encounter: Payer: Self-pay | Admitting: *Deleted

## 2013-01-14 ENCOUNTER — Encounter (INDEPENDENT_AMBULATORY_CARE_PROVIDER_SITE_OTHER): Payer: Self-pay | Admitting: Surgery

## 2013-01-14 VITALS — BP 134/70 | HR 64 | Resp 16 | Ht 66.5 in | Wt 173.4 lb

## 2013-01-14 DIAGNOSIS — L732 Hidradenitis suppurativa: Secondary | ICD-10-CM

## 2013-01-14 NOTE — Progress Notes (Signed)
General Surgery [facility]  Chief Complaint  Patient presents with  . Follow-up    hidradenitis surgery in Feb 2014    HISTORY: Patient is a 42 year old female who underwent excision of hidradenitis from bilateral axillae in February 2014. Postoperatively she has done well. She has had a slight flare of symptoms on the right side and presents today for evaluation.  Patient is currently not using antibacterial soap. She is not on antibiotics. She is not using topical antibiotic lotions.  EXAM: Left axilla shows a well-healed surgical incision. No sign of recurrent hidradenitis. Right axilla shows a well-healed surgical incision. There is a single palpable nodular density inferior to the incision in the anterior axilla area and this is not acutely inflamed. There is also an area on the medial right upper arm which has some epithelial slough from recent inflammation but is currently not draining and not fluctuant.  IMPRESSION: #1 status post excision hidradenitis bilateral axillae #2 mild hidradenitis right axilla and right upper extremity  PLAN: Patient should be using an antibacterial soap such as Dial or Lever 2000. Patient may benefit from a topical clindamycin lotion but this is quite expensive. Perhaps her primary care physician will have the way to obtain this for the patient at a reduced cost.  At this point there is no role for surgical intervention. Patient will return for surgical care as needed.  Velora Heckler, MD, FACS General & Endocrine Surgery [facility]   Visit Diagnoses: 1. Hidradenitis suppurativa

## 2013-01-14 NOTE — Patient Instructions (Signed)
Hidradenitis Suppurativa, Sweat Gland Abscess °Hidradenitis suppurativa is a long lasting (chronic), uncommon disease of the sweat glands. With this, boil-like lumps and scarring develop in the groin, some times under the arms (axillae), and under the breasts. It may also uncommonly occur behind the ears, in the crease of the buttocks, and around the genitals.  °CAUSES  °The cause is from a blocking of the sweat glands. They then become infected. It may cause drainage and odor. It is not contagious. So it cannot be given to someone else. It most often shows up in puberty (about 10 to 42 years of age). But it may happen much later. It is similar to acne which is a disease of the sweat glands. This condition is slightly more common in African-Americans and women. °SYMPTOMS  °· Hidradenitis usually starts as one or more red, tender, swellings in the groin or under the arms (axilla). °· Over a period of hours to days the lesions get larger. They often open to the skin surface, draining clear to yellow-colored fluid. °· The infected area heals with scarring. °DIAGNOSIS  °Your caregiver makes this diagnosis by looking at you. Sometimes cultures (growing germs on plates in the lab) may be taken. This is to see what germ (bacterium) is causing the infection.  °TREATMENT  °· Topical germ killing medicine applied to the skin (antibiotics) are the treatment of choice. Antibiotics taken by mouth (systemic) are sometimes needed when the condition is getting worse or is severe. °· Avoid tight-fitting clothing which traps moisture in. °· Dirt does not cause hidradenitis and it is not caused by poor hygiene. °· Involved areas should be cleaned daily using an antibacterial soap. Some patients find that the liquid form of Lever 2000®, applied to the involved areas as a lotion after bathing, can help reduce the odor related to this condition. °· Sometimes surgery is needed to drain infected areas or remove scarred tissue. Removal of  large amounts of tissue is used only in severe cases. °· Birth control pills may be helpful. °· Oral retinoids (vitamin A derivatives) for 6 to 12 months which are effective for acne may also help this condition. °· Weight loss will improve but not cure hidradenitis. It is made worse by being overweight. But the condition is not caused by being overweight. °· This condition is more common in people who have had acne. °· It may become worse under stress. °There is no medical cure for hidradenitis. It can be controlled, but not cured. The condition usually continues for years with periods of getting worse and getting better (remission). °Document Released: 11/15/2003 Document Revised: 06/25/2011 Document Reviewed: 12/01/2007 °Exit[employer]® Patient Information ©2014 [employer]. ° °

## 2013-01-15 ENCOUNTER — Encounter: Payer: Medicaid Other | Admitting: *Deleted

## 2013-01-15 ENCOUNTER — Encounter: Payer: Self-pay | Admitting: *Deleted

## 2013-01-15 DIAGNOSIS — E663 Overweight: Secondary | ICD-10-CM | POA: Insufficient documentation

## 2013-01-15 DIAGNOSIS — Z0000 Encounter for general adult medical examination without abnormal findings: Secondary | ICD-10-CM | POA: Insufficient documentation

## 2013-01-15 LAB — TB SKIN TEST: TB Skin Test: NEGATIVE

## 2013-01-15 NOTE — Assessment & Plan Note (Signed)
Discussed w/ pt and recommended life style changes regarding diet and exercise.

## 2013-01-15 NOTE — Assessment & Plan Note (Addendum)
No complaints. PMHx significant for recurrent Hydradenitis Suppurative that is f/u by General Surgery. Pt will get PPD testing per her employment request Declines Pap smear. Last one done was in 08/03/2008. Discussed with pt and recommended Q3 years. Pt overweight last labs in 2010 with Glucose in 101, not sure if this test was fasting or not. Pt decline blood testing today. She reports is asymptomatic. More detail explained under the specific problem.

## 2013-01-15 NOTE — Progress Notes (Signed)
Family Medicine Office Visit Note   Subjective:   Patient ID: Mallory Lin, female  DOB: 04-Nov-1970, 42 y.o.. MRN: [id number]   Pt that comes today for her annual exam. She has no current complaints. 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 She is also requesting TB testing for same purposes.  Pt has PMHx ox recurrent axillary "boils" that were incised and f/u buy [facility] Surgery. Reports no active lesions.   Review of Systems:  Pt denies SOB, chest pain, palpitations, headaches, dizziness, numbness or weakness. No changes on urinary or BM habits. No unintentional weigh loss/gain.  Objective:   Physical Exam: Gen:  NAD HEENT: Moist mucous membranes  CV: Regular rate and rhythm, no murmurs rubs or gallops PULM: Clear to auscultation bilaterally. No wheezes/rales/rhonchi ABD: Soft, non tender, non distended, normal bowel sounds EXT: No edema. Bilateral axillar scars. No masses palpated.  Neuro: Alert and oriented x3. No focalization [id number] pr pt today.  Assessment & Plan:

## 2013-03-09 ENCOUNTER — Ambulatory Visit (INDEPENDENT_AMBULATORY_CARE_PROVIDER_SITE_OTHER): Payer: Medicaid Other | Admitting: Family Medicine

## 2013-03-09 ENCOUNTER — Encounter: Payer: Self-pay | Admitting: Family Medicine

## 2013-03-09 VITALS — BP 120/72 | HR 68 | Temp 98.7°F | Resp 16 | Ht 64.0 in | Wt 177.0 lb

## 2013-03-09 DIAGNOSIS — R232 Flushing: Secondary | ICD-10-CM

## 2013-03-09 DIAGNOSIS — R300 Dysuria: Secondary | ICD-10-CM

## 2013-03-09 DIAGNOSIS — Z0000 Encounter for general adult medical examination without abnormal findings: Secondary | ICD-10-CM

## 2013-03-09 DIAGNOSIS — N951 Menopausal and female climacteric states: Secondary | ICD-10-CM

## 2013-03-09 LAB — BASIC METABOLIC PANEL
CO2: 29 mEq/L (ref 19–32)
Chloride: 102 mEq/L (ref 96–112)
Creat: 0.7 mg/dL (ref 0.50–1.10)
Sodium: 140 mEq/L (ref 135–145)

## 2013-03-09 LAB — CBC
MCV: 89.4 fL (ref 78.0–100.0)
Platelets: 241 10*3/uL (ref 150–400)
RBC: 3.98 MIL/uL (ref 3.87–5.11)
WBC: 5.3 10*3/uL (ref 4.0–10.5)

## 2013-03-09 LAB — POCT UA - MICROSCOPIC ONLY

## 2013-03-09 LAB — POCT URINALYSIS DIPSTICK
Glucose, UA: NEGATIVE
Spec Grav, UA: 1.025
Urobilinogen, UA: 1

## 2013-03-09 MED ORDER — BACITRACIN 500 UNIT/GM EX OINT: 1.0000 "application " | TOPICAL_OINTMENT | Freq: Two times a day (BID) | CUTANEOUS | Status: DC

## 2013-03-09 MED ORDER — GABAPENTIN 300 MG PO CAPS: 300.0000 mg | ORAL_CAPSULE | Freq: Every day | ORAL | Status: DC

## 2013-03-09 NOTE — Patient Instructions (Signed)
Please take the medications as prescribed. Gabapentin 300mg  at bedtime. I will call you with the labs results if they come back abnormal otherwise you will receive a letter. Make an appointment for pap smear.

## 2013-03-09 NOTE — Progress Notes (Signed)
Pt is here for a 42 y/o women's pe Voices no new concerns. Alert w/no signs of acute distress.

## 2013-03-09 NOTE — Progress Notes (Signed)
Family Medicine Office Visit Note   Subjective:   Patient ID: Mallory Lin, female  DOB: 07-09-1970, 42 y.o.. MRN: [id number]   Pt that comes today for her annual visit but she had one done on 01/12/2013. She then mentions couple of concerns.   Hot flashes: pt had tubal ligation 6 years ago and she report no menses since then. She is suffering with hot flashes mostly at night for the past year. Pt denies FHx of early menopause. She denies tremors, fatigue, cold intolerance or other systemic symptoms.   Pain on right inguinal area and mild dysuria: Reports symptoms started 2 weeks ago, no frequency, flank pain, fever, nausea or vomiting. She states her pain is mild and has history of symptoms like this a while ago and wanted to make sure did not have an UTI.   Review of Systems:  Per HPI  Objective:   Physical Exam: Gen:  NAD HEENT: Moist mucous membranes  CV: Regular rate and rhythm, no murmurs rubs or gallops PULM: Clear to auscultation bilaterally. No wheezes/rales/rhonchi ABD: Soft, non tender, non distended, normal bowel sounds. No CVA tenderness. No bulging in groin, erythema or tenderness to palpation.  EXT: No edema.  Neuro: Alert and oriented x3. No focalization Pelvic (pt declines pelvic exam today but stated she will make appointment separatedly for GYN evaluation including Pap Smear)  Assessment & Plan:

## 2013-03-10 ENCOUNTER — Telehealth: Payer: Self-pay | Admitting: Family Medicine

## 2013-03-10 DIAGNOSIS — R232 Flushing: Secondary | ICD-10-CM | POA: Insufficient documentation

## 2013-03-10 DIAGNOSIS — R300 Dysuria: Secondary | ICD-10-CM | POA: Insufficient documentation

## 2013-03-10 DIAGNOSIS — R319 Hematuria, unspecified: Secondary | ICD-10-CM

## 2013-03-10 LAB — FSH/LH: LH: 28.5 m[IU]/mL

## 2013-03-10 NOTE — Assessment & Plan Note (Signed)
Pt with UA positive for RBC's since 2012. No imaging or further studies documented seen. Will f/u if pt still with dysuria will treat as UTI and f/u if only pain on groin is present imaging is indicated and will order renal ultrasound.

## 2013-03-10 NOTE — Assessment & Plan Note (Signed)
Pt states she will make a separated visit for pap smear.

## 2013-03-10 NOTE — Assessment & Plan Note (Signed)
Unsure if pt is truly menopausal. Will obtain FSH and LH in order to determine hormonal cause of he symptoms.

## 2013-03-10 NOTE — Telephone Encounter (Signed)
Called pt and informed about her labs results. FSH/LH are in menopausal range. Normal TSH. Hot flashes most likely from this. Recommended gabapentin as prescribed. Regarding her UA: pt informed about blood present in urine. She reports to be asymptomatic today. We discussed renal ultrasound and pt agreeable with plan. I will place order today. Also informed about mild anemia, denies active bleeding signs and pt agreeable to discuss this further at her next appointment. May need anemia work up including FOBT.

## 2013-03-26 ENCOUNTER — Encounter: Payer: Self-pay | Admitting: Family Medicine

## 2013-03-26 ENCOUNTER — Ambulatory Visit (INDEPENDENT_AMBULATORY_CARE_PROVIDER_SITE_OTHER): Payer: Medicaid Other | Admitting: Family Medicine

## 2013-03-26 ENCOUNTER — Other Ambulatory Visit (HOSPITAL_COMMUNITY)
Admission: RE | Admit: 2013-03-26 | Discharge: 2013-03-26 | Disposition: A | Discharge status: Home / Self Care | Payer: Medicaid Other | Source: Ambulatory Visit | Attending: Family Medicine | Admitting: Family Medicine

## 2013-03-26 VITALS — BP 123/71 | HR 67 | Temp 97.8°F | Ht 66.0 in | Wt 179.5 lb

## 2013-03-26 DIAGNOSIS — N951 Menopausal and female climacteric states: Secondary | ICD-10-CM

## 2013-03-26 DIAGNOSIS — Z124 Encounter for screening for malignant neoplasm of cervix: Secondary | ICD-10-CM

## 2013-03-26 DIAGNOSIS — Z1151 Encounter for screening for human papillomavirus (HPV): Secondary | ICD-10-CM | POA: Insufficient documentation

## 2013-03-26 DIAGNOSIS — R232 Flushing: Secondary | ICD-10-CM

## 2013-03-26 DIAGNOSIS — Z01419 Encounter for gynecological examination (general) (routine) without abnormal findings: Secondary | ICD-10-CM | POA: Insufficient documentation

## 2013-03-26 DIAGNOSIS — R319 Hematuria, unspecified: Secondary | ICD-10-CM | POA: Insufficient documentation

## 2013-03-26 DIAGNOSIS — Z87448 Personal history of other diseases of urinary system: Secondary | ICD-10-CM

## 2013-03-26 DIAGNOSIS — D649 Anemia, unspecified: Secondary | ICD-10-CM | POA: Insufficient documentation

## 2013-03-26 MED ORDER — GABAPENTIN 100 MG PO CAPS: 100.0000 mg | ORAL_CAPSULE | Freq: Three times a day (TID) | ORAL | Status: DC

## 2013-03-26 NOTE — Assessment & Plan Note (Signed)
FSH/LH at Postmenopausal levels. Gabapentin 300mg  gave her side effects but pt interested in trying at lower dose. Gabapentin 100mg  QHS prescribed and instructed to stop if side effects are noted.

## 2013-03-26 NOTE — Assessment & Plan Note (Addendum)
Normal MCV. Asymptomatic. No PFHx positive for cancer, pt is non smoker. She denies active vaginal or GI  bleeding but has been with microhematuria documented since 03/2011 without further work up. Discussed importance of renal ultrasound and anemia work up. Pt declines studies at this time but agreeable to more studies if her hematuria persists at her next UA or concerning symptoms appear. CBC placed as future order.

## 2013-03-26 NOTE — Progress Notes (Signed)
Family Medicine Office Visit Note   Subjective:   Patient ID: Mallory Lin, female  DOB: 02-25-1971, 42 y.o.. MRN: [id number]   Pt that comes today for her pap smear. She reports no vaginal discharge or bleeding. Continues to have hot flashes since she only took Gabapentin once and d/c it due to side effects of dizziness and feeling "sick" with 300mg  capsule dose that resolved after medication was discontinued. Denies urinary symptoms. No back or abdominal pain. Report no symptoms since her last appointment and wishes to postpone U/S.  Regarding mild anemia. Asymptomatic and pt prefers to repeat/postpone lab work if needed. No FMHx of cancer. Non smoker  Review of Systems:  Pt denies SOB, chest pain, palpitations, headaches, numbness or weakness. No changes on urinary or BM habits. No unintentional weigh loss/gain.   Objective:   Physical Exam: Gen:  NAD HEENT: Moist mucous membranes  CV: Regular rate and rhythm, no murmurs  PULM: Clear to auscultation bilaterally.  ABD: Soft, non tender, non distended EXT: No edema Neuro: Alert and oriented x3. No focalization Vulva and perianal area: Normal  Speculum: Vagina and cervix of normal appearance, no friability, no discharge. Bimanual exam: Uterus anteverted, no adnexal masses. No cervical motion tenderness.  Assessment & Plan:

## 2013-03-26 NOTE — Patient Instructions (Addendum)
It has been a pleasure to see you today. Please take the medications as prescribed. Gabapentin has been reduced to 100 mg at night as needed for hot flashes. Make a lab appointment in 2 months to re-evaluate if you continue to have blood in the urine or sooner if you develop other concerning symptoms.

## 2013-03-26 NOTE — Assessment & Plan Note (Addendum)
Present since Dec/2012. No further studies documented. Pt reports is asymptomatic at this point and declines work up at this time but is agreeable to repeat UA after 3 months and U/S if hematuria has not resolved by then. Urinalysis placed as future order, also her ultrasound is placed as open order when she is agreeable to get it done.

## 2013-03-30 ENCOUNTER — Encounter: Payer: Self-pay | Admitting: Family Medicine

## 2013-03-30 ENCOUNTER — Telehealth: Payer: Self-pay | Admitting: *Deleted

## 2013-03-30 NOTE — Telephone Encounter (Signed)
Following up on Renal US that was ordered. Appointment was made for Wednesday 8am 12/17 patient needs to drink 16oz water before visit. Patient was informed.

## 2013-04-01 ENCOUNTER — Ambulatory Visit (HOSPITAL_COMMUNITY)
Admission: RE | Admit: 2013-04-01 | Discharge: 2013-04-01 | Disposition: A | Discharge status: Home / Self Care | Payer: Medicaid Other | Source: Ambulatory Visit | Attending: Family Medicine | Admitting: Family Medicine

## 2013-04-01 DIAGNOSIS — R319 Hematuria, unspecified: Secondary | ICD-10-CM | POA: Insufficient documentation

## 2013-04-01 DIAGNOSIS — N289 Disorder of kidney and ureter, unspecified: Secondary | ICD-10-CM | POA: Insufficient documentation

## 2013-04-03 ENCOUNTER — Telehealth: Payer: Self-pay | Admitting: Family Medicine

## 2013-04-03 DIAGNOSIS — R319 Hematuria, unspecified: Secondary | ICD-10-CM

## 2013-04-03 NOTE — Telephone Encounter (Signed)
Called pt and informed about her U/S results. 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 Unclear id this has relation with her hematuria. Discussed in depth and will place a referral for Urology in order to further work up  Hematuria.

## 2013-04-03 NOTE — Telephone Encounter (Signed)
I called pt and informed results. Thanks.

## 2013-04-03 NOTE — Telephone Encounter (Signed)
Pt wants to know results of xray done on Wed

## 2013-04-14 ENCOUNTER — Telehealth: Payer: Self-pay | Admitting: Family Medicine

## 2013-04-14 NOTE — Telephone Encounter (Signed)
Called pt, her mother answer phone just let her mother know that pt has to call us back.   Appt 05/13/2013 @ 9:15  [facility]  [address] [place] [address] [facility] [place] [address]  [phone]

## 2013-12-05 ENCOUNTER — Encounter (HOSPITAL_COMMUNITY): Payer: Self-pay | Admitting: Emergency Medicine

## 2013-12-05 ENCOUNTER — Emergency Department (HOSPITAL_COMMUNITY)
Admission: EM | Admit: 2013-12-05 | Discharge: 2013-12-05 | Disposition: A | Discharge status: Home / Self Care | Payer: Medicaid Other | Attending: Emergency Medicine | Admitting: Emergency Medicine

## 2013-12-05 DIAGNOSIS — Y9389 Activity, other specified: Secondary | ICD-10-CM | POA: Insufficient documentation

## 2013-12-05 DIAGNOSIS — Y9289 Other specified places as the place of occurrence of the external cause: Secondary | ICD-10-CM | POA: Diagnosis not present

## 2013-12-05 DIAGNOSIS — Z87440 Personal history of urinary (tract) infections: Secondary | ICD-10-CM | POA: Diagnosis not present

## 2013-12-05 DIAGNOSIS — S0180XA Unspecified open wound of other part of head, initial encounter: Secondary | ICD-10-CM | POA: Diagnosis not present

## 2013-12-05 DIAGNOSIS — Z79899 Other long term (current) drug therapy: Secondary | ICD-10-CM | POA: Insufficient documentation

## 2013-12-05 DIAGNOSIS — Z8719 Personal history of other diseases of the digestive system: Secondary | ICD-10-CM | POA: Insufficient documentation

## 2013-12-05 DIAGNOSIS — IMO0002 Reserved for concepts with insufficient information to code with codable children: Secondary | ICD-10-CM | POA: Diagnosis not present

## 2013-12-05 DIAGNOSIS — S0181XA Laceration without foreign body of other part of head, initial encounter: Secondary | ICD-10-CM

## 2013-12-05 DIAGNOSIS — Z792 Long term (current) use of antibiotics: Secondary | ICD-10-CM | POA: Insufficient documentation

## 2013-12-05 NOTE — ED Notes (Signed)
Declined W/C at D/C and was escorted to lobby by RN. 

## 2013-12-05 NOTE — ED Provider Notes (Signed)
CSN: [id number]     Arrival date & time 12/05/13  [id number] History   First MD Initiated Contact with Patient 12/05/13 [id number]     Chief Complaint  Patient presents with  . Laceration   Patient is a 43 y.o. female presenting with skin laceration.  Laceration   Patient is a 43 y.o. Female who presents to the ED with laceration above the right eyebrow.  Patient states that she was taking pictures on a bed last night and hit her head on a ceiling fan that was on. Patient states that she did not fall or lose consciousness.  She states that she immediately started bleeding immediately.  She cleaned her laceration out with hydrogen peroxide, soap and water, and placed some neosporin on it.  Patient states that she is up to date on her tetanus as she got one last year prior to surgery to remove hydradenitis excision.  Patient denies fever, chills, nausea, vomiting, changes in her vision, dizziness, shortness of breath, chest pain, loss of balance, or weakness.  She does admit to a headache.  All other ROS are negative.  Past Medical History  Diagnosis Date  . Recurrent boils   . UTI (lower urinary tract infection)    Past Surgical History  Procedure Laterality Date  . Incision and drainage abscess anal    . Tubal ligation    . Incise and drain abcess      for hidradenitis  . Teeth extracted    . Hydradenitis excision Bilateral 06/06/2012    Procedure: EXCISION HYDRADENITIS AXILLA;  Surgeon: Velora Hecklerodd M Gerkin, MD;  Location: [facility];  Service: General;  Laterality: Bilateral;   History reviewed. No pertinent family history. History  Substance Use Topics  . Smoking status: Never Smoker   . Smokeless tobacco: Not on file  . Alcohol Use: No   OB History   Grav Para Term Preterm Abortions TAB SAB Ect Mult Living                 Review of Systems  See HPI  Allergies  Review of patient's allergies indicates no known allergies.  Home Medications   Prior to Admission medications    Medication Sig Start Date End Date Taking? Authorizing Provider  bacitracin 500 UNIT/GM ointment Apply 1 application topically 2 (two) times daily. 03/09/13   Dayarmys [place] Criselda PeachesLa Paz, MD  gabapentin (NEURONTIN) 100 MG capsule Take 1 capsule (100 mg total) by mouth 3 (three) times daily. 03/26/13   Dayarmys [place] Cris[place]da PeachesLa Paz, MD   BP 147/87  Pulse 76  Temp(Src) 98 F (36.7 C) (Oral)  Resp 20  SpO2 98%  LMP 07/04/2011 Physical Exam  Nursing note and vitals reviewed. Constitutional: She is oriented to person, place, and time. She appears well-developed and well-nourished. No distress.  HENT:  Head: Normocephalic.  Nose: Nose normal.  Mouth/Throat: Oropharynx is clear and moist. No oropharyngeal exudate.  2 cm linear laceration to the right eyebrow.  Bleeding well controlled.  Partial thickness.  No evidence of foreign bodies on palpation.  Eyes: Conjunctivae and EOM are normal. Pupils are equal, round, and reactive to light. No scleral icterus.  Neck: Normal range of motion. Neck supple. No JVD present. No thyromegaly present.  Cardiovascular: Normal rate, regular rhythm, normal heart sounds and intact distal pulses.  Exam reveals no gallop and no friction rub.   No murmur heard. Pulmonary/Chest: Effort normal and breath sounds normal. No respiratory distress. She has no wheezes. She  has no rales. She exhibits no tenderness.  Abdominal: Soft. Bowel sounds are normal. She exhibits no distension and no mass. There is no tenderness. There is no rebound and no guarding.  Musculoskeletal: Normal range of motion.  Lymphadenopathy:    She has no cervical adenopathy.  Neurological: She is alert and oriented to person, place, and time. She has normal strength. No cranial nerve deficit or sensory deficit. Coordination normal.  Skin: Skin is warm and dry. She is not diaphoretic.  Psychiatric: She has a normal mood and affect. Her behavior is normal. Judgment and thought content normal.    ED  Course  LACERATION REPAIR Date/Time: 12/05/2013 9:48 AM Performed by: Terri Piedra A Authorized by: Terri Piedra A Consent: Verbal consent obtained. Risks and benefits: risks, benefits and alternatives were discussed Consent given by: patient Patient understanding: patient states understanding of the procedure being performed Patient consent: the patient's understanding of the procedure matches consent given Procedure consent: procedure consent matches procedure scheduled Relevant documents: relevant documents present and verified Test results: test results available and properly labeled Site marked: the operative site was marked Imaging studies: imaging studies available Patient identity confirmed: verbally with patient Time out: Immediately prior to procedure a "time out" was called to verify the correct patient, procedure, equipment, support staff and site/side marked as required. Body area: head/neck Location details: right eyebrow Laceration length: 2 cm Foreign bodies: no foreign bodies Tendon involvement: none Nerve involvement: none Vascular damage: no Anesthesia: local infiltration Local anesthetic: lidocaine 2% with epinephrine Anesthetic total: 2 ml Patient sedated: no Preparation: Patient was prepped and draped in the usual sterile fashion. Irrigation solution: saline Irrigation method: syringe Amount of cleaning: standard Debridement: none Degree of undermining: none Skin closure: 5-0 Prolene Number of sutures: 3 Technique: simple Approximation: close Approximation difficulty: simple Dressing: bandaid. Patient tolerance: Patient tolerated the procedure well with no immediate complications.   (including critical care time) Labs Review Labs Reviewed - No data to display  Imaging Review No results found.   EKG Interpretation None      MDM   Final diagnoses:  Laceration of face, initial encounter   Patient is a 43 y.o. Female who presents  to the ED with laceration to the right eye brow.  Physical exam reveals no focal neuro deficits.  Laceration was repaired as seen above.  Tetanus is up to date.  Patient is stable for discharge.  She was told to have sutures removed in 3-5 days by her pcp.  Patient to return for signs of infection.  Patient was told it was okay to take tylenol or ibuprofen as needed for her headache.  Patient is stable for discharge and states understanding and agreement to the above plan.      Eben Burow, PA-C 12/05/13 [phone]

## 2013-12-05 NOTE — Discharge Instructions (Signed)
Facial Laceration ° A facial laceration is a cut on the face. These injuries can be painful and cause bleeding. Lacerations usually heal quickly, but they need special care to reduce scarring. °DIAGNOSIS  °Your health care provider will take a medical history, ask for details about how the injury occurred, and examine the wound to determine how deep the cut is. °TREATMENT  °Some facial lacerations may not require closure. Others may not be able to be closed because of an increased risk of infection. The risk of infection and the chance for successful closure will depend on various factors, including the amount of time since the injury occurred. °The wound may be cleaned to help prevent infection. If closure is appropriate, pain medicines may be given if needed. Your health care provider will use stitches (sutures), wound glue (adhesive), or skin adhesive strips to repair the laceration. These tools bring the skin edges together to allow for faster healing and a better cosmetic outcome. If needed, you may also be given a tetanus shot. °HOME CARE INSTRUCTIONS °· Only take over-the-counter or prescription medicines as directed by your health care provider. °· Follow your health care provider's instructions for wound care. These instructions will vary depending on the technique used for closing the wound. °For Sutures: °· Keep the wound clean and dry.   °· If you were given a bandage (dressing), you should change it at least once a day. Also change the dressing if it becomes wet or dirty, or as directed by your health care provider.   °· Wash the wound with soap and water 2 times a day. Rinse the wound off with water to remove all soap. Pat the wound dry with a clean towel.   °· After cleaning, apply a thin layer of the antibiotic ointment recommended by your health care provider. This will help prevent infection and keep the dressing from sticking.   °· You may shower as usual after the first 24 hours. Do not soak the  wound in water until the sutures are removed.   °· Get your sutures removed as directed by your health care provider. With facial lacerations, sutures should usually be taken out after 4-5 days to avoid stitch marks.   °· Wait a few days after your sutures are removed before applying any makeup. °For Skin Adhesive Strips: °· Keep the wound clean and dry.   °· Do not get the skin adhesive strips wet. You may bathe carefully, using caution to keep the wound dry.   °· If the wound gets wet, pat it dry with a clean towel.   °· Skin adhesive strips will fall off on their own. You may trim the strips as the wound heals. Do not remove skin adhesive strips that are still stuck to the wound. They will fall off in time.   °For Wound Adhesive: °· You may briefly wet your wound in the shower or bath. Do not soak or scrub the wound. Do not swim. Avoid periods of heavy sweating until the skin adhesive has fallen off on its own. After showering or bathing, gently pat the wound dry with a clean towel.   °· Do not apply liquid medicine, cream medicine, ointment medicine, or makeup to your wound while the skin adhesive is in place. This may loosen the film before your wound is healed.   °· If a dressing is placed over the wound, be careful not to apply tape directly over the skin adhesive. This may cause the adhesive to be pulled off before the wound is healed.   °· Avoid   prolonged exposure to sunlight or tanning lamps while the skin adhesive is in place.  The skin adhesive will usually remain in place for 5-10 days, then naturally fall off the skin. Do not pick at the adhesive film.  After Healing: Once the wound has healed, cover the wound with sunscreen during the day for 1 full year. This can help minimize scarring. Exposure to ultraviolet light in the first year will darken the scar. It can take 1-2 years for the scar to lose its redness and to heal completely.  SEEK IMMEDIATE MEDICAL CARE IF:  You have redness, pain, or  swelling around the wound.   You see ayellowish-white fluid (pus) coming from the wound.   You have chills or a fever.  MAKE SURE YOU:  Understand these instructions.  Will watch your condition.  Will get help right away if you are not doing well or get worse. Document Released: 05/10/2004 Document Revised: 01/21/2013 Document Reviewed: 11/13/2012 [facility] Patient Information 2015 [employer], [place]. This information is not intended to replace advice given to you by your health care provider. Make sure you discuss any questions you have with your health care provider.  Laceration Care, Adult A laceration is a cut or lesion that goes through all layers of the skin and into the tissue just beneath the skin. TREATMENT  Some lacerations may not require closure. Some lacerations may not be able to be closed due to an increased risk of infection. It is important to see your caregiver as soon as possible after an injury to minimize the risk of infection and maximize the opportunity for successful closure. If closure is appropriate, pain medicines may be given, if needed. The wound will be cleaned to help prevent infection. Your caregiver will use stitches (sutures), staples, wound glue (adhesive), or skin adhesive strips to repair the laceration. These tools bring the skin edges together to allow for faster healing and a better cosmetic outcome. However, all wounds will heal with a scar. Once the wound has healed, scarring can be minimized by covering the wound with sunscreen during the day for 1 full year. HOME CARE INSTRUCTIONS  For sutures or staples:  Keep the wound clean and dry.  If you were given a bandage (dressing), you should change it at least once a day. Also, change the dressing if it becomes wet or dirty, or as directed by your caregiver.  Wash the wound with soap and water 2 times a day. Rinse the wound off with water to remove all soap. Pat the wound dry with a clean  towel.  After cleaning, apply a thin layer of the antibiotic ointment as recommended by your caregiver. This will help prevent infection and keep the dressing from sticking.  You may shower as usual after the first 24 hours. Do not soak the wound in water until the sutures are removed.  Only take over-the-counter or prescription medicines for pain, discomfort, or fever as directed by your caregiver.  Get your sutures or staples removed as directed by your caregiver. For skin adhesive strips:  Keep the wound clean and dry.  Do not get the skin adhesive strips wet. You may bathe carefully, using caution to keep the wound dry.  If the wound gets wet, pat it dry with a clean towel.  Skin adhesive strips will fall off on their own. You may trim the strips as the wound heals. Do not remove skin adhesive strips that are still stuck to the wound. They will fall  off in time. For wound adhesive:  You may briefly wet your wound in the shower or bath. Do not soak or scrub the wound. Do not swim. Avoid periods of heavy perspiration until the skin adhesive has fallen off on its own. After showering or bathing, gently pat the wound dry with a clean towel.  Do not apply liquid medicine, cream medicine, or ointment medicine to your wound while the skin adhesive is in place. This may loosen the film before your wound is healed.  If a dressing is placed over the wound, be careful not to apply tape directly over the skin adhesive. This may cause the adhesive to be pulled off before the wound is healed.  Avoid prolonged exposure to sunlight or tanning lamps while the skin adhesive is in place. Exposure to ultraviolet light in the first year will darken the scar.  The skin adhesive will usually remain in place for 5 to 10 days, then naturally fall off the skin. Do not pick at the adhesive film. You may need a tetanus shot if:  You cannot remember when you had your last tetanus shot.  You have never had a  tetanus shot. If you get a tetanus shot, your arm may swell, get red, and feel warm to the touch. This is common and not a problem. If you need a tetanus shot and you choose not to have one, there is a rare chance of getting tetanus. Sickness from tetanus can be serious. SEEK MEDICAL CARE IF:   You have redness, swelling, or increasing pain in the wound.  You see a red line that goes away from the wound.  You have yellowish-white fluid (pus) coming from the wound.  You have a fever.  You notice a bad smell coming from the wound or dressing.  Your wound breaks open before or after sutures have been removed.  You notice something coming out of the wound such as wood or glass.  Your wound is on your hand or foot and you cannot move a finger or toe. SEEK IMMEDIATE MEDICAL CARE IF:   Your pain is not controlled with prescribed medicine.  You have severe swelling around the wound causing pain and numbness or a change in color in your arm, hand, leg, or foot.  Your wound splits open and starts bleeding.  You have worsening numbness, weakness, or loss of function of any joint around or beyond the wound.  You develop painful lumps near the wound or on the skin anywhere on your body. MAKE SURE YOU:   Understand these instructions.  Will watch your condition.  Will get help right away if you are not doing well or get worse. Document Released: 04/02/2005 Document Revised: 06/25/2011 Document Reviewed: 09/26/2010 [facility] Patient Information 2015 [place], [place]. This information is not intended to replace advice given to you by your health care provider. Make sure you discuss any questions you have with your health care provider.

## 2013-12-05 NOTE — ED Notes (Signed)
Pt reports she was standing on a bed and a celling fan hit her head . Pt reports a cut abve RT eye and bump to forehead.  Pt denies LOC at time of accident.

## 2013-12-09 ENCOUNTER — Ambulatory Visit (INDEPENDENT_AMBULATORY_CARE_PROVIDER_SITE_OTHER): Payer: Medicaid Other | Admitting: *Deleted

## 2013-12-09 DIAGNOSIS — Z4802 Encounter for removal of sutures: Secondary | ICD-10-CM

## 2013-12-09 NOTE — ED Provider Notes (Signed)
Medical screening examination/treatment/procedure(s) were performed by non-physician practitioner and as supervising physician I was immediately available for consultation/collaboration.   EKG Interpretation None        Candyce Churn III, MD 12/09/13 [phone]

## 2013-12-09 NOTE — Progress Notes (Signed)
   Pt in nurse clinic for suture removal of right eyebrow area.  Suture were placed 12/05/2013 by emergency department.  Pt stated suture could be removed in 3-5 days.  Wound assessed; 3 sutures in place; no redness, drainage or swelling noted.  Area cleaned; one suture was removed; nurse noticed that wound was not completed healed.  Precepted with Dr. Lum Babe; Dr. Lum Babe assessed wound and have pt return in another 2 days for reminder 2 suture to be removed or pt can go to urgent care.  Pt stated understanding.  Clovis Pu, RN

## 2013-12-16 ENCOUNTER — Ambulatory Visit (INDEPENDENT_AMBULATORY_CARE_PROVIDER_SITE_OTHER): Payer: Medicaid Other | Admitting: *Deleted

## 2013-12-16 DIAGNOSIS — Z4802 Encounter for removal of sutures: Secondary | ICD-10-CM

## 2013-12-16 NOTE — Progress Notes (Signed)
   Pt in nurse clinic for reminder suture removal.  Two sutures removed from right eye brow with difficultly.  Area was clean; no redness, drainage or swelling noted.  Pt to follow with PCP as needed.  Clovis Pu, RN

## 2014-01-04 ENCOUNTER — Ambulatory Visit: Payer: Medicaid Other | Admitting: Family Medicine

## 2014-01-12 ENCOUNTER — Encounter (HOSPITAL_COMMUNITY): Payer: Self-pay | Admitting: Emergency Medicine

## 2014-01-12 ENCOUNTER — Emergency Department (HOSPITAL_COMMUNITY)
Admission: EM | Admit: 2014-01-12 | Discharge: 2014-01-12 | Disposition: A | Discharge status: Home / Self Care | Payer: Medicaid Other | Attending: Emergency Medicine | Admitting: Emergency Medicine

## 2014-01-12 DIAGNOSIS — J029 Acute pharyngitis, unspecified: Secondary | ICD-10-CM | POA: Insufficient documentation

## 2014-01-12 DIAGNOSIS — J020 Streptococcal pharyngitis: Secondary | ICD-10-CM | POA: Insufficient documentation

## 2014-01-12 DIAGNOSIS — Z87440 Personal history of urinary (tract) infections: Secondary | ICD-10-CM | POA: Diagnosis not present

## 2014-01-12 DIAGNOSIS — IMO0001 Reserved for inherently not codable concepts without codable children: Secondary | ICD-10-CM | POA: Diagnosis not present

## 2014-01-12 DIAGNOSIS — Z872 Personal history of diseases of the skin and subcutaneous tissue: Secondary | ICD-10-CM | POA: Insufficient documentation

## 2014-01-12 LAB — RAPID STREP SCREEN (MED CTR MEBANE ONLY): Streptococcus, Group A Screen (Direct): POSITIVE — AB

## 2014-01-12 MED ORDER — HYDROCODONE-ACETAMINOPHEN 5-325 MG PO TABS
1.0000 | ORAL_TABLET | Freq: Once | ORAL | Status: AC
  Administered 2014-01-12: 1 via ORAL
  Filled 2014-01-12: qty 1

## 2014-01-12 MED ORDER — PENICILLIN G BENZATHINE 1200000 UNIT/2ML IM SUSP
1.2000 10*6.[IU] | Freq: Once | INTRAMUSCULAR | Status: AC
  Administered 2014-01-12: 1.2 10*6.[IU] via INTRAMUSCULAR
  Filled 2014-01-12: qty 2

## 2014-01-12 MED ORDER — HYDROCODONE-ACETAMINOPHEN 5-325 MG PO TABS: 1.0000 | ORAL_TABLET | Freq: Four times a day (QID) | ORAL | Status: DC | PRN

## 2014-01-12 NOTE — ED Provider Notes (Signed)
Medical screening examination/treatment/procedure(s) were conducted as a shared visit with non-physician practitioner(s) or resident  and myself.  I personally evaluated the patient during the encounter and agree with the findings.   I have personally reviewed any xrays and/ or EKG's with the provider and I agree with interpretation.   Sore throat for 5 days.  Low grade fever. No trismus, uvular deviation, unilateral posterior pharyngeal edema or submandibular swelling. Mild posterior pharyngeal erythema/ exudate.  Labs Reviewed  RAPID STREP SCREEN - Abnormal; Notable for the following:    Streptococcus, Group A Screen (Direct) POSITIVE (*)    All other components within normal limits    Acute strep pharyngitis  Enid SkeensJoshua M Kynan Peasley, MD 01/12/14 [id number]

## 2014-01-12 NOTE — ED Provider Notes (Signed)
CSN: [id number]     Arrival date & time 01/12/14  0403 History   First MD Initiated Contact with Patient 01/12/14 0440     Chief Complaint  Patient presents with  . Sore Throat     (Consider location/radiation/quality/duration/timing/severity/associated sxs/prior Treatment) HPI Comments: Patient presents today with 5 days of persistent worsening sore throat, worse with swallowing.  She, states, initially, she had a low-grade fever.  This has resolved.  She's been taking over-the-counter medication without any relief.  Denies any other symptoms. Patient is unaware of any sick contacts  Patient is a 43 y.o. female presenting with pharyngitis. The history is provided by the patient.  Sore Throat This is a new problem. The current episode started in the past 7 days. The problem occurs constantly. The problem has been gradually worsening. Associated symptoms include myalgias, a sore throat and swollen glands. Pertinent negatives include no chills, coughing or fever. The symptoms are aggravated by swallowing. She has tried acetaminophen and NSAIDs for the symptoms. The treatment provided no relief.    Past Medical History  Diagnosis Date  . Recurrent boils   . UTI (lower urinary tract infection)    Past Surgical History  Procedure Laterality Date  . Incision and drainage abscess anal    . Tubal ligation    . Incise and drain abcess      for hidradenitis  . Teeth extracted    . Hydradenitis excision Bilateral 06/06/2012    Procedure: EXCISION HYDRADENITIS AXILLA;  Surgeon: Velora Hecklerodd M Gerkin, MD;  Location: [facility];  Service: General;  Laterality: Bilateral;   No family history on file. History  Substance Use Topics  . Smoking status: Never Smoker   . Smokeless tobacco: Not on file  . Alcohol Use: No   OB History   Grav Para Term Preterm Abortions TAB SAB Ect Mult Living                 Review of Systems  Constitutional: Negative for fever and chills.  HENT:  Positive for sore throat and trouble swallowing. Negative for drooling.   Respiratory: Negative for cough and shortness of breath.   Musculoskeletal: Positive for myalgias.  All other systems reviewed and are negative.     Allergies  Review of patient's allergies indicates no known allergies.  Home Medications   Prior to Admission medications   Medication Sig Start Date End Date Taking? Authorizing Provider  naproxen sodium (ANAPROX) 220 MG tablet Take 220 mg by mouth 2 (two) times daily as needed (for pain).   Yes Historical Provider, MD   BP 129/73  Pulse 81  Temp(Src) 98.6 F (37 C) (Oral)  Resp 16  Ht 5\' 6"  (1.676 m)  Wt 170 lb (77.111 kg)  BMI 27.45 kg/m2  SpO2 100%  LMP 07/04/2011 Physical Exam  Nursing note and vitals reviewed. Constitutional: She appears well-developed and well-nourished.  HENT:  Head: Normocephalic.  Mouth/Throat: Uvula is midline. Oropharyngeal exudate and posterior oropharyngeal erythema present.  Eyes: Pupils are equal, round, and reactive to light.  Neck: Normal range of motion.  Cardiovascular: Normal rate.   Pulmonary/Chest: Effort normal.  Musculoskeletal: Normal range of motion.  Lymphadenopathy:    She has cervical adenopathy.  Neurological: She is alert.  Skin: Skin is warm. No rash noted.    ED Course  Procedures (including critical care time) Labs Review Labs Reviewed  RAPID STREP SCREEN    Imaging Review No results found.   EKG Interpretation None  MDM   Final diagnoses:  None         Arman Filter, NP 01/12/14 [phone]

## 2014-01-12 NOTE — Discharge Instructions (Signed)
Your strep test is positive.  U. been given an injection of long-acting penicillin.  He will not need any further antibiotics.  You have been given a prescription for Vicodin to help with pain control.  Over the next one to 2 days, with surgery as much fluid as possible

## 2014-01-12 NOTE — ED Notes (Signed)
Patient presents to ED via POV. Patient c/o a "sore throat" x 5 days with no improvement after taking OTC medications. Pt also c/o a "runny nose" and head congestion. Patient is A&Ox4. No acute distress noted.

## 2014-03-23 ENCOUNTER — Encounter: Payer: Self-pay | Admitting: Family Medicine

## 2014-03-23 ENCOUNTER — Ambulatory Visit (INDEPENDENT_AMBULATORY_CARE_PROVIDER_SITE_OTHER): Payer: Medicaid Other | Admitting: Family Medicine

## 2014-03-23 ENCOUNTER — Telehealth: Payer: Self-pay | Admitting: Family Medicine

## 2014-03-23 ENCOUNTER — Other Ambulatory Visit (HOSPITAL_COMMUNITY)
Admission: RE | Admit: 2014-03-23 | Discharge: 2014-03-23 | Disposition: A | Discharge status: Home / Self Care | Payer: Medicaid Other | Source: Ambulatory Visit | Attending: Family Medicine | Admitting: Family Medicine

## 2014-03-23 VITALS — BP 138/86 | HR 76 | Temp 98.6°F | Ht 66.0 in | Wt 177.1 lb

## 2014-03-23 DIAGNOSIS — Z113 Encounter for screening for infections with a predominantly sexual mode of transmission: Secondary | ICD-10-CM | POA: Insufficient documentation

## 2014-03-23 DIAGNOSIS — N760 Acute vaginitis: Secondary | ICD-10-CM | POA: Insufficient documentation

## 2014-03-23 DIAGNOSIS — N898 Other specified noninflammatory disorders of vagina: Secondary | ICD-10-CM

## 2014-03-23 LAB — POCT WET PREP (WET MOUNT): CLUE CELLS WET PREP WHIFF POC: POSITIVE

## 2014-03-23 MED ORDER — METRONIDAZOLE 500 MG PO TABS: 500.0000 mg | ORAL_TABLET | Freq: Two times a day (BID) | ORAL | Status: DC

## 2014-03-23 NOTE — Progress Notes (Signed)
Patient ID: Unk PintoLisa A Capelle, female   DOB: 04/16/1971, 43 y.o.   MRN: [id number]  HPI:  Pt presents for a same day appointment to discuss vaginal discharge.  Has had vag d/c for about 3 days. Has not tried any medicines. Both thick and thin, white in color. No abx in last 30 days, no sex in last month. One female partner in the last year. Unsure about exposures to STD's, would like to be tested today. No recent testing for this. Hx of chlamydia as a teenager. No itching or odor. Gets periods about every other month, lasting 2-3 days. No intermenstrual bleeding. Hx of tubal ligation. No pelvic pain.   Also notes hx of hidradenitis suppuritiva, recently had surgical procedure for this affecting her axillae. Continues to get bumps. No acute bumps today. Advised she f/u with her PCP or surgeon for this as this was a same-day clinic appointment for an acute issue.  ROS: See HPI  PMFSH: hx hidradenitis, overweight  PHYSICAL EXAM: BP 138/86 mmHg  Pulse 76  Temp(Src) 98.6 F (37 C) (Oral)  Ht 5\' 6"  (1.676 m)  Wt 177 lb 1.6 oz (80.332 kg)  BMI 28.60 kg/m2  LMP 07/04/2011 Gen: NAD HEENT: NCAT Neuro: grossly nonfocal, speech normal GU: normal appearing external genitalia. Scarring of vulvar area and inner thighs present from prior hidradenitis, no acute warmth/tenderness/induration/erythema. Vagina is moist with thick white and clear thin discharge. Cervix normal in appearance. No cervical motion tenderness or tenderness on bimanual exam. No adnexal masses. No foreign bodies.  ASSESSMENT/PLAN:  1. Vaginal discharge: Etiology not immediately clear by hx or exam, may represent yeast or BV (although no odor or itching. Check wet prep today. Also will check gc/chlamydia/trichomonas, HIV, RPR, acute hepatitis panel today per pt request for STD screening. UTD on pap.  FOLLOW UP: F/u as needed if symptoms worsen or do not improve.  F/u separately with PCP or surgeon for hidradenitis.  GrenadaBrittany J.  Pollie MeyerMcIntyre, MD [facility]

## 2014-03-23 NOTE — Telephone Encounter (Signed)
[facility] red team, please call pt and let her know:  Wet prep showed bacterial vaginosis, likely the cause of her discharge. Should take flagyl 500mg  BID x 7 days. I sent in a prescription. No alcohol while she is taking this medicine. We'll call her with the rest of her lab results when they come in.  Thanks! Latrelle DodrillBrittany J Evani Shrider, MD

## 2014-03-23 NOTE — Telephone Encounter (Signed)
Patient informed, expressed understanding. 

## 2014-03-23 NOTE — Patient Instructions (Signed)
We'll call you with your lab results.  Be well, Dr. Randye LoboMcIntyre    Safe Sex Safe sex is about reducing the risk of giving or getting a sexually transmitted disease (STD). STDs are spread through sexual contact involving the genitals, mouth, or rectum. Some STDs can be cured and others cannot. Safe sex can also prevent unintended pregnancies.  WHAT ARE SOME SAFE SEX PRACTICES?  Limit your sexual activity to only one partner who is having sex with only you.  Talk to your partner about his or her past partners, past STDs, and drug use.  Use a condom every time you have sexual intercourse. This includes vaginal, oral, and anal sexual activity. Both females and males should wear condoms during oral sex. Only use latex or polyurethane condoms and water-based lubricants. Using petroleum-based lubricants or oils to lubricate a condom will weaken the condom and increase the chance that it will break. The condom should be in place from the beginning to the end of sexual activity. Wearing a condom reduces, but does not completely eliminate, your risk of getting or giving an STD. STDs can be spread by contact with infected body fluids and skin.  Get vaccinated for hepatitis B and HPV.  Avoid alcohol and recreational drugs, which can affect your judgment. You may forget to use a condom or participate in high-risk sex.  For females, avoid douching after sexual intercourse. Douching can spread an infection farther into the reproductive tract.  Check your body for signs of sores, blisters, rashes, or unusual discharge. See your health care provider if you notice any of these signs.  Avoid sexual contact if you have symptoms of an infection or are being treated for an STD. If you or your partner has herpes, avoid sexual contact when blisters are present. Use condoms at all other times.  If you are at risk of being infected with HIV, it is recommended that you take a prescription medicine daily to prevent HIV  infection. This is called pre-exposure prophylaxis (PrEP). You are considered at risk if:  You are a man who has sex with other men (MSM).  You are a heterosexual man or woman who is sexually active with more than one partner.  You take drugs by injection.  You are sexually active with a partner who has HIV.  Talk with your health care provider about whether you are at high risk of being infected with HIV. If you choose to begin PrEP, you should first be tested for HIV. You should then be tested every 3 months for as long as you are taking PrEP.  See your health care provider for regular screenings, exams, and tests for other STDs. Before having sex with a new partner, each of you should be screened for STDs and should talk about the results with each other. WHAT ARE THE BENEFITS OF SAFE SEX?   There is less chance of getting or giving an STD.  You can prevent unwanted or unintended pregnancies.  By discussing safe sex concerns with your partner, you may increase feelings of intimacy, comfort, trust, and honesty between the two of you. Document Released: 05/10/2004 Document Revised: 08/17/2013 Document Reviewed: 09/24/2011 [facility] Patient Information 2015 [employer] [place]. This information is not intended to replace advice given to you by your health care provider. Make sure you discuss any questions you have with your health care provider.

## 2014-03-24 LAB — HEPATITIS PANEL, ACUTE
HCV Ab: NEGATIVE
HEP A IGM: NONREACTIVE
HEP B C IGM: NONREACTIVE
Hepatitis B Surface Ag: NEGATIVE

## 2014-03-24 LAB — HIV ANTIBODY (ROUTINE TESTING W REFLEX): HIV 1&2 Ab, 4th Generation: NONREACTIVE

## 2014-03-24 LAB — CERVICOVAGINAL ANCILLARY ONLY
CHLAMYDIA, DNA PROBE: NEGATIVE
NEISSERIA GONORRHEA: NEGATIVE
Trichomonas: NEGATIVE

## 2014-03-24 LAB — RPR

## 2014-04-05 ENCOUNTER — Telehealth: Payer: Self-pay | Admitting: *Deleted

## 2014-04-05 NOTE — Telephone Encounter (Signed)
-----   Message from Latrelle DodrillBrittany J McIntyre, MD sent at 04/02/2014  6:37 PM EST ----- Please call pt and inform that all STD tests were negative. Thanks! Latrelle DodrillBrittany J McIntyre, MD

## 2014-04-05 NOTE — Telephone Encounter (Signed)
Patient informed of negative results, expressed understanding. 

## 2014-09-28 ENCOUNTER — Encounter: Payer: Self-pay | Admitting: Family Medicine

## 2014-09-28 ENCOUNTER — Other Ambulatory Visit (HOSPITAL_COMMUNITY)
Admission: RE | Admit: 2014-09-28 | Discharge: 2014-09-28 | Disposition: A | Discharge status: Home / Self Care | Payer: Medicaid Other | Source: Ambulatory Visit | Attending: Family Medicine | Admitting: Family Medicine

## 2014-09-28 ENCOUNTER — Ambulatory Visit (INDEPENDENT_AMBULATORY_CARE_PROVIDER_SITE_OTHER): Payer: Medicaid Other | Admitting: Family Medicine

## 2014-09-28 VITALS — BP 125/91 | HR 70 | Temp 98.0°F | Wt 178.0 lb

## 2014-09-28 DIAGNOSIS — Z113 Encounter for screening for infections with a predominantly sexual mode of transmission: Secondary | ICD-10-CM | POA: Insufficient documentation

## 2014-09-28 DIAGNOSIS — L732 Hidradenitis suppurativa: Secondary | ICD-10-CM

## 2014-09-28 DIAGNOSIS — N898 Other specified noninflammatory disorders of vagina: Secondary | ICD-10-CM

## 2014-09-28 LAB — POCT WET PREP (WET MOUNT): CLUE CELLS WET PREP WHIFF POC: POSITIVE

## 2014-09-28 MED ORDER — CLINDAMYCIN PHOSPHATE 1 % EX SOLN: Freq: Two times a day (BID) | CUTANEOUS | Status: DC

## 2014-09-28 MED ORDER — CLINDAMYCIN HCL 300 MG PO CAPS: 300.0000 mg | ORAL_CAPSULE | Freq: Two times a day (BID) | ORAL | Status: DC

## 2014-09-28 NOTE — Assessment & Plan Note (Signed)
Rx for topical clindamycin 1% when necessary - Discussed bleach baths with patient

## 2014-09-28 NOTE — Patient Instructions (Signed)
It was great seeing you today.   1. Take clindamycin 300 mg twice a day for 7 days 2. Return to clinic for reevaluation.  The symptoms do not resolve after the treatment 3. I also called in clindamycin 1% cream to treat her hidradenitis suppurativa   If you have any questions or concerns before then, please call the clinic at [phone].  Take Care,   Dr Wenda Low

## 2014-09-28 NOTE — Progress Notes (Signed)
   Subjective:    Patient ID: Mallory Lin, female    DOB: [birth year], 44 y.o.   MRN: [id number]  Seen for Same day visit for   CC: vaginal discharge  VAGINAL DISCHARGE  Having vaginal discharge for 2-3 days. Medications tried: none Discharge consistency: Perfuse, watery Discharge color: Clear Recent antibiotic use: No Sex in last month: Yes with a monogamous partner without condom use Possible STD exposure:possible  Symptoms Fever: no Dysuria:no Vaginal bleeding: no Abdomen or Pelvic pain: no Back pain: no Genital sores or ulcers:no Rash: no Pain during sex: no Missed menstrual period: No menstrual cycle for over a year.  Having signs of menopause with hot flashes  ROS see HPI Smoking Status noted  eview of Systems   See HPI for ROS. Objective:  BP 125/91 mmHg  Pulse 70  Temp(Src) 98 F (36.7 C) (Oral)  Wt 178 lb (80.74 kg)  LMP 07/04/2011  General: NAD Cardiac: RRR, normal heart sounds, no murmurs. 2+ radial and PT pulses bilaterally Respiratory: CTAB, normal effort Abdomen: soft, nontender, nondistended Speculum Exam: Ext genitalia: wnl; Vaginal discharge: Profuse watery drinking; Cervix: wnl Bimanual Exam: deferred    Assessment & Plan:  See Problem List Documentation

## 2014-09-28 NOTE — Progress Notes (Signed)
I was available as preceptor to resident for this patient's office visit.  

## 2014-09-29 LAB — CERVICOVAGINAL ANCILLARY ONLY
CHLAMYDIA, DNA PROBE: NEGATIVE
Neisseria Gonorrhea: NEGATIVE

## 2014-10-01 ENCOUNTER — Emergency Department (HOSPITAL_COMMUNITY): Payer: No Typology Code available for payment source

## 2014-10-01 ENCOUNTER — Emergency Department (HOSPITAL_COMMUNITY)
Admission: EM | Admit: 2014-10-01 | Discharge: 2014-10-01 | Disposition: A | Discharge status: Home / Self Care | Payer: No Typology Code available for payment source | Attending: Emergency Medicine | Admitting: Emergency Medicine

## 2014-10-01 DIAGNOSIS — S161XXA Strain of muscle, fascia and tendon at neck level, initial encounter: Secondary | ICD-10-CM | POA: Diagnosis not present

## 2014-10-01 DIAGNOSIS — S0093XA Contusion of unspecified part of head, initial encounter: Secondary | ICD-10-CM | POA: Diagnosis not present

## 2014-10-01 DIAGNOSIS — S3992XA Unspecified injury of lower back, initial encounter: Secondary | ICD-10-CM | POA: Diagnosis not present

## 2014-10-01 DIAGNOSIS — Z87440 Personal history of urinary (tract) infections: Secondary | ICD-10-CM | POA: Diagnosis not present

## 2014-10-01 DIAGNOSIS — Z791 Long term (current) use of non-steroidal anti-inflammatories (NSAID): Secondary | ICD-10-CM | POA: Insufficient documentation

## 2014-10-01 DIAGNOSIS — S0081XA Abrasion of other part of head, initial encounter: Secondary | ICD-10-CM | POA: Insufficient documentation

## 2014-10-01 DIAGNOSIS — S0001XA Abrasion of scalp, initial encounter: Secondary | ICD-10-CM | POA: Insufficient documentation

## 2014-10-01 DIAGNOSIS — Y92410 Unspecified street and highway as the place of occurrence of the external cause: Secondary | ICD-10-CM | POA: Diagnosis not present

## 2014-10-01 DIAGNOSIS — Z872 Personal history of diseases of the skin and subcutaneous tissue: Secondary | ICD-10-CM | POA: Diagnosis not present

## 2014-10-01 DIAGNOSIS — Y999 Unspecified external cause status: Secondary | ICD-10-CM | POA: Diagnosis not present

## 2014-10-01 DIAGNOSIS — Y939 Activity, unspecified: Secondary | ICD-10-CM | POA: Diagnosis not present

## 2014-10-01 DIAGNOSIS — Z792 Long term (current) use of antibiotics: Secondary | ICD-10-CM | POA: Diagnosis not present

## 2014-10-01 DIAGNOSIS — S0990XA Unspecified injury of head, initial encounter: Secondary | ICD-10-CM | POA: Diagnosis present

## 2014-10-01 MED ORDER — HYDROCODONE-ACETAMINOPHEN 5-325 MG PO TABS: 2.0000 | ORAL_TABLET | ORAL | Status: DC | PRN

## 2014-10-01 MED ORDER — IBUPROFEN 800 MG PO TABS: 800.0000 mg | ORAL_TABLET | Freq: Three times a day (TID) | ORAL | Status: DC
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
## 2014-10-01 NOTE — ED Notes (Signed)
Pt alert and oriented x4. Respirations even and unlabored, bilateral symmetrical rise and fall of chest. Skin warm and dry. In no acute distress. Denies needs.   
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
## 2014-10-01 NOTE — Discharge Instructions (Signed)
Abrasion °An abrasion is a cut or scrape of the skin. Abrasions do not extend through all layers of the skin and most heal within 10 days. It is important to care for your abrasion properly to prevent infection. °CAUSES  °Most abrasions are caused by falling on, or gliding across, the ground or other surface. When your skin rubs on something, the outer and inner layer of skin rubs off, causing an abrasion. °DIAGNOSIS  °Your caregiver will be able to diagnose an abrasion during a physical exam.  °TREATMENT  °Your treatment depends on how large and deep the abrasion is. Generally, your abrasion will be cleaned with water and a mild soap to remove any dirt or debris. An antibiotic ointment may be put over the abrasion to prevent an infection. A bandage (dressing) may be wrapped around the abrasion to keep it from getting dirty.  °You may need a tetanus shot if: °· You cannot remember when you had your last tetanus shot. °· You have never had a tetanus shot. °· The injury broke your skin. °If you get a tetanus shot, your arm may swell, get red, and feel warm to the touch. This is common and not a problem. If you need a tetanus shot and you choose not to have one, there is a rare chance of getting tetanus. Sickness from tetanus can be serious.  °HOME CARE INSTRUCTIONS  °· If a dressing was applied, change it at least once a day or as directed by your caregiver. If the bandage sticks, soak it off with warm water.   °· Wash the area with water and a mild soap to remove all the ointment 2 times a day. Rinse off the soap and pat the area dry with a clean towel.   °· Reapply any ointment as directed by your caregiver. This will help prevent infection and keep the bandage from sticking. Use gauze over the wound and under the dressing to help keep the bandage from sticking.   °· Change your dressing right away if it becomes wet or dirty.   °· Only take over-the-counter or prescription medicines for pain, discomfort, or fever as  directed by your caregiver.   °· Follow up with your caregiver within 24-48 hours for a wound check, or as directed. If you were not given a wound-check appointment, look closely at your abrasion for redness, swelling, or pus. These are signs of infection. °SEEK IMMEDIATE MEDICAL CARE IF:  °· You have increasing pain in the wound.   °· You have redness, swelling, or tenderness around the wound.   °· You have pus coming from the wound.   °· You have a fever or persistent symptoms for more than 2-3 days. °· You have a fever and your symptoms suddenly get worse. °· You have a bad smell coming from the wound or dressing.   °MAKE SURE YOU:  °· Understand these instructions. °· Will watch your condition. °· Will get help right away if you are not doing well or get worse. °Document Released: 01/10/2005 Document Revised: 03/19/2012 Document Reviewed: 03/06/2011 °ExitCare® Patient Information ©2015 [employer]. This information is not intended to replace advice given to you by your health care provider. Make sure you discuss any questions you have with your health care provider. ° °Cervical Sprain °A cervical sprain is an injury in the neck in which the strong, fibrous tissues (ligaments) that connect your neck bones stretch or tear. Cervical sprains can range from mild to severe. Severe cervical sprains can cause the neck vertebrae to be unstable.   This can lead to damage of the spinal cord and can result in serious nervous system problems. The amount of time it takes for a cervical sprain to get better depends on the cause and extent of the injury. Most cervical sprains heal in 1 to 3 weeks. °CAUSES  °Severe cervical sprains may be caused by:  °· Contact sport injuries (such as from football, rugby, wrestling, hockey, auto racing, gymnastics, diving, martial arts, or boxing).   °· Motor vehicle collisions.   °· Whiplash injuries. This is an injury from a sudden forward and backward whipping movement of the head and  neck.  °· Falls.   °Mild cervical sprains may be caused by:  °· Being in an awkward position, such as while cradling a telephone between your ear and shoulder.   °· Sitting in a chair that does not offer proper support.   °· Working at a poorly designed computer station.   °· Looking up or down for long periods of time.   °SYMPTOMS  °· Pain, soreness, stiffness, or a burning sensation in the front, back, or sides of the neck. This discomfort may develop immediately after the injury or slowly, 24 hours or more after the injury.   °· Pain or tenderness directly in the middle of the back of the neck.   °· Shoulder or upper back pain.   °· Limited ability to move the neck.   °· Headache.   °· Dizziness.   °· Weakness, numbness, or tingling in the hands or arms.   °· Muscle spasms.   °· Difficulty swallowing or chewing.   °· Tenderness and swelling of the neck.   °DIAGNOSIS  °Most of the time your health care provider can diagnose a cervical sprain by taking your history and doing a physical exam. Your health care provider will ask about previous neck injuries and any known neck problems, such as arthritis in the neck. X-rays may be taken to find out if there are any other problems, such as with the bones of the neck. Other tests, such as a CT scan or MRI, may also be needed.  °TREATMENT  °Treatment depends on the severity of the cervical sprain. Mild sprains can be treated with rest, keeping the neck in place (immobilization), and pain medicines. Severe cervical sprains are immediately immobilized. Further treatment is done to help with pain, muscle spasms, and other symptoms and may include: °· Medicines, such as pain relievers, numbing medicines, or muscle relaxants.   °· Physical therapy. This may involve stretching exercises, strengthening exercises, and posture training. Exercises and improved posture can help stabilize the neck, strengthen muscles, and help stop symptoms from returning.   °HOME CARE INSTRUCTIONS   °· Put ice on the injured area.   °¨ Put ice in a plastic bag.   °¨ Place a towel between your skin and the bag.   °¨ Leave the ice on for 15-20 minutes, 3-4 times a day.   °· If your injury was severe, you may have been given a cervical collar to wear. A cervical collar is a two-piece collar designed to keep your neck from moving while it heals. °¨ Do not remove the collar unless instructed by your health care provider. °¨ If you have long hair, keep it outside of the collar. °¨ Ask your health care provider before making any adjustments to your collar. Minor adjustments may be required over time to improve comfort and reduce pressure on your chin or on the back of your head. °¨ If you are allowed to remove the collar for cleaning or bathing, follow your health care provider's instructions on how to do   so safely. °¨ Keep your collar clean by wiping it with mild soap and water and drying it completely. If the collar you have been given includes removable pads, remove them every 1-2 days and hand wash them with soap and water. Allow them to air dry. They should be completely dry before you wear them in the collar. °¨ If you are allowed to remove the collar for cleaning and bathing, wash and dry the skin of your neck. Check your skin for irritation or sores. If you see any, tell your health care provider. °¨ Do not drive while wearing the collar.   °· Only take over-the-counter or prescription medicines for pain, discomfort, or fever as directed by your health care provider.   °· Keep all follow-up appointments as directed by your health care provider.   °· Keep all physical therapy appointments as directed by your health care provider.   °· Make any needed adjustments to your workstation to promote good posture.   °· Avoid positions and activities that make your symptoms worse.   °· Warm up and stretch before being active to help prevent problems.   °SEEK MEDICAL CARE IF:  °· Your pain is not controlled with  medicine.   °· You are unable to decrease your pain medicine over time as planned.   °· Your activity level is not improving as expected.   °SEEK IMMEDIATE MEDICAL CARE IF:  °· You develop any bleeding. °· You develop stomach upset. °· You have signs of an allergic reaction to your medicine.   °· Your symptoms get worse.   °· You develop new, unexplained symptoms.   °· You have numbness, tingling, weakness, or paralysis in any part of your body.   °MAKE SURE YOU:  °· Understand these instructions. °· Will watch your condition. °· Will get help right away if you are not doing well or get worse. °Document Released: 01/28/2007 Document Revised: 04/07/2013 Document Reviewed: 10/08/2012 °ExitCare® Patient Information ©2015 [employer]. This information is not intended to replace advice given to you by your health care provider. Make sure you discuss any questions you have with your health care provider. ° °Motor Vehicle Collision °It is common to have multiple bruises and sore muscles after a motor vehicle collision (MVC). These tend to feel worse for the first 24 hours. You may have the most stiffness and soreness over the first several hours. You may also feel worse when you wake up the first morning after your collision. After this point, you will usually begin to improve with each day. The speed of improvement often depends on the severity of the collision, the number of injuries, and the location and nature of these injuries. °HOME CARE INSTRUCTIONS °· Put ice on the injured area. °¨ Put ice in a plastic bag. °¨ Place a towel between your skin and the bag. °¨ Leave the ice on for 15-20 minutes, 3-4 times a day, or as directed by your health care provider. °· Drink enough fluids to keep your urine clear or pale yellow. Do not drink alcohol. °· Take a warm shower or bath once or twice a day. This will increase blood flow to sore muscles. °· You may return to activities as directed by your caregiver. Be careful when  lifting, as this may aggravate neck or back pain. °· Only take over-the-counter or prescription medicines for pain, discomfort, or fever as directed by your caregiver. Do not use aspirin. This may increase bruising and bleeding. °SEEK IMMEDIATE MEDICAL CARE IF: °· You have numbness, tingling, or weakness in the arms or   legs. °· You develop severe headaches not relieved with medicine. °· You have severe neck pain, especially tenderness in the middle of the back of your neck. °· You have changes in bowel or bladder control. °· There is increasing pain in any area of the body. °· You have shortness of breath, light-headedness, dizziness, or fainting. °· You have chest pain. °· You feel sick to your stomach (nauseous), throw up (vomit), or sweat. °· You have increasing abdominal discomfort. °· There is blood in your urine, stool, or vomit. °· You have pain in your shoulder (shoulder strap areas). °· You feel your symptoms are getting worse. °MAKE SURE YOU: °· Understand these instructions. °· Will watch your condition. °· Will get help right away if you are not doing well or get worse. °Document Released: 04/02/2005 Document Revised: 08/17/2013 Document Reviewed: 08/30/2010 °ExitCare® Patient Information ©2015 [employer]. This information is not intended to replace advice given to you by your health care provider. Make sure you discuss any questions you have with your health care provider. ° °
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
## 2014-10-04 ENCOUNTER — Ambulatory Visit (INDEPENDENT_AMBULATORY_CARE_PROVIDER_SITE_OTHER): Payer: Self-pay | Admitting: Family Medicine

## 2014-10-04 DIAGNOSIS — M546 Pain in thoracic spine: Secondary | ICD-10-CM

## 2014-10-04 DIAGNOSIS — M542 Cervicalgia: Secondary | ICD-10-CM

## 2014-10-04 DIAGNOSIS — M62838 Other muscle spasm: Secondary | ICD-10-CM

## 2014-10-04 MED ORDER — CYCLOBENZAPRINE HCL 10 MG PO TABS: 10.0000 mg | ORAL_TABLET | Freq: Three times a day (TID) | ORAL | Status: DC | PRN

## 2014-10-04 MED ORDER — TRAMADOL HCL 50 MG PO TABS: 50.0000 mg | ORAL_TABLET | Freq: Three times a day (TID) | ORAL | Status: DC | PRN

## 2014-10-04 NOTE — Patient Instructions (Addendum)
Use the hydrocodone and flexeril at night.  Take the tramadol for pain during the day. Feel free to continue using the ibuprofen.  Follow-up if she failed to improve or worsen. It will take some time for this to resolve but I anticipate you will make a full recovery fairly quickly.  Take care  Dr. Adriana Simas
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
## 2014-10-10 ENCOUNTER — Encounter (HOSPITAL_COMMUNITY): Payer: Self-pay | Admitting: Emergency Medicine

## 2014-10-10 ENCOUNTER — Emergency Department (INDEPENDENT_AMBULATORY_CARE_PROVIDER_SITE_OTHER)
Admission: EM | Admit: 2014-10-10 | Discharge: 2014-10-10 | Disposition: A | Discharge status: Home / Self Care | Payer: Medicaid Other | Source: Home / Self Care | Attending: Family Medicine | Admitting: Family Medicine

## 2014-10-10 DIAGNOSIS — N760 Acute vaginitis: Secondary | ICD-10-CM

## 2014-10-10 LAB — POCT URINALYSIS DIP (DEVICE)
Bilirubin Urine: NEGATIVE
Glucose, UA: 100 mg/dL — AB
Ketones, ur: NEGATIVE mg/dL
LEUKOCYTES UA: NEGATIVE
Nitrite: NEGATIVE
Protein, ur: NEGATIVE mg/dL
Urobilinogen, UA: 0.2 mg/dL (ref 0.0–1.0)
pH: 6 (ref 5.0–8.0)

## 2014-10-10 MED ORDER — FLUCONAZOLE 150 MG PO TABS: 150.0000 mg | ORAL_TABLET | Freq: Every day | ORAL | Status: DC

## 2014-10-10 NOTE — Discharge Instructions (Signed)
You likely have a yeast infection. Please take the Diflucan as prescribed. Please follow-up with her primary care physician for further issues if needed. Please remember to use a hypoallergenic fragrance free dye free Dove soap over the next several days.

## 2014-10-10 NOTE — ED Notes (Signed)
Pt states that she was on a antibiotic and she states that when she finished the medication she started having vaginal itching.

## 2014-10-10 NOTE — ED Provider Notes (Signed)
CSN: [id number]     Arrival date & time 10/10/14  1620 History   First MD Initiated Contact with Patient 10/10/14 1626     Chief Complaint  Patient presents with  . Vaginal Itching   (Consider location/radiation/quality/duration/timing/severity/associated sxs/prior Treatment) HPI  Patient seen by PCP on 10/01/2014 and diagnosed with BV. Initially prescribed metronidazole but he should request a change in medication due to symptoms of upset stomach. Patient placed on clindamycin. Resolution of initial symptoms but then developed vaginal irritation and itching 4 days ago. Symptoms are intermittent. Denies discharge, abdominal pain, fever, frequency, dysuria, back pain. No sexual intercourse since previous vaginal exam 9 days ago   Past Medical History  Diagnosis Date  . Recurrent boils   . UTI (lower urinary tract infection)    Past Surgical History  Procedure Laterality Date  . Incision and drainage abscess anal    . Tubal ligation    . Incise and drain abcess      for hidradenitis  . Teeth extracted    . Hydradenitis excision Bilateral 06/06/2012    Procedure: EXCISION HYDRADENITIS AXILLA;  Surgeon: Velora Heckler, MD;  Location: [facility];  Service: General;  Laterality: Bilateral;   History reviewed. No pertinent family history. History  Substance Use Topics  . Smoking status: Never Smoker   . Smokeless tobacco: Not on file  . Alcohol Use: No   OB History    No data available     Review of Systems Per HPI with all other pertinent systems negative.    Allergies  Review of patient's allergies indicates no known allergies.  Home Medications   Prior to Admission medications   Medication Sig Start Date End Date Taking? Authorizing Provider  cyclobenzaprine (FLEXERIL) 10 MG tablet Take 1 tablet (10 mg total) by mouth 3 (three) times daily as needed for muscle spasms. 10/04/14   Tommie Sams, DO  fluconazole (DIFLUCAN) 150 MG tablet Take 1 tablet (150 mg  total) by mouth daily. Repeat dose in 3 days 10/10/14   Ozella Rocks, MD  HYDROcodone-acetaminophen (NORCO/VICODIN) 5-325 MG per tablet Take 2 tablets by mouth every 4 (four) hours as needed. 10/01/14   Elson Areas, PA-C  ibuprofen (ADVIL,MOTRIN) 800 MG tablet Take 1 tablet (800 mg total) by mouth 3 (three) times daily. 10/01/14   Elson Areas, PA-C  traMADol (ULTRAM) 50 MG tablet Take 1 tablet (50 mg total) by mouth every 8 (eight) hours as needed. 10/04/14   Jayce G Cook, DO   BP 130/79 mmHg  Pulse 78  Temp(Src) 98.3 F (36.8 C) (Oral)  Resp 16  SpO2 100%  LMP 07/04/2011 Physical Exam Physical Exam  Constitutional: oriented to person, place, and time. appears well-developed and well-nourished. No distress.  HENT:  Head: Normocephalic and atraumatic.  Eyes: EOMI. PERRL.  Neck: Normal range of motion.  Cardiovascular: RRR, no m/r/g, 2+ distal pulses,  Pulmonary/Chest: Effort normal and breath sounds normal. No respiratory distress.  Abdominal: Soft. Bowel sounds are normal. NonTTP, no distension.  Musculoskeletal: Normal range of motion. Non ttp, no effusion.  Neurological: alert and oriented to person, place, and time.  Skin: Skin is warm. No rash noted. non diaphoretic.  Psychiatric: normal mood and affect. behavior is normal. Judgment and thought content normal.   ED Course  Procedures (including critical care time) Labs Review Labs Reviewed  POCT URINALYSIS DIP (DEVICE) - Abnormal; Notable for the following:    Glucose, UA 100 (*)  Hgb urine dipstick SMALL (*)    All other components within normal limits    Imaging Review No results found.   MDM   1. Vaginitis    Likely yeast vaginitis given recent review of labs showing no STDs and BV. Patient treated with antibiotics with beginnings of symptoms shortly after finishing antibiotics. Descriptive symptoms typical for yeast vaginitis. Start Diflucan.    Ozella Rocks, MD 10/10/14 [phone]

## 2014-10-13 ENCOUNTER — Telehealth: Payer: Self-pay | Admitting: Family Medicine

## 2014-10-13 NOTE — Telephone Encounter (Signed)
Will forward to MD to see if this can be placed. Jazmin Hartsell,CMA

## 2014-10-13 NOTE — Telephone Encounter (Signed)
I do not feel that she needs to be seen by specialist. She should follow-up with her primary physician or preferably her new primary physician if discomfort continues.

## 2014-10-13 NOTE — Telephone Encounter (Signed)
Pt called because her back and neck are still hurting and would like a referral to see a specialist. jw

## 2014-10-19 NOTE — Telephone Encounter (Signed)
LM for patient to call back.  Please inform of message below from provider.  She has an appt with Dr. Waynetta SandyWight next week and can discuss this concern then. Thanks  Limited BrandsJazmin Avarie Lin,CMA

## 2014-10-28 ENCOUNTER — Ambulatory Visit: Payer: Medicaid Other | Admitting: Family Medicine

## 2014-11-28 ENCOUNTER — Emergency Department (HOSPITAL_COMMUNITY)
Admission: EM | Admit: 2014-11-28 | Discharge: 2014-11-28 | Disposition: A | Discharge status: Home / Self Care | Payer: Medicaid Other | Attending: Emergency Medicine | Admitting: Emergency Medicine

## 2014-11-28 ENCOUNTER — Encounter (HOSPITAL_COMMUNITY): Payer: Self-pay | Admitting: Emergency Medicine

## 2014-11-28 DIAGNOSIS — Z872 Personal history of diseases of the skin and subcutaneous tissue: Secondary | ICD-10-CM | POA: Diagnosis not present

## 2014-11-28 DIAGNOSIS — J020 Streptococcal pharyngitis: Secondary | ICD-10-CM | POA: Insufficient documentation

## 2014-11-28 DIAGNOSIS — J0300 Acute streptococcal tonsillitis, unspecified: Secondary | ICD-10-CM

## 2014-11-28 DIAGNOSIS — Z791 Long term (current) use of non-steroidal anti-inflammatories (NSAID): Secondary | ICD-10-CM | POA: Insufficient documentation

## 2014-11-28 DIAGNOSIS — J029 Acute pharyngitis, unspecified: Secondary | ICD-10-CM | POA: Diagnosis present

## 2014-11-28 DIAGNOSIS — Z87440 Personal history of urinary (tract) infections: Secondary | ICD-10-CM | POA: Diagnosis not present

## 2014-11-28 DIAGNOSIS — Z79899 Other long term (current) drug therapy: Secondary | ICD-10-CM | POA: Diagnosis not present

## 2014-11-28 LAB — RAPID STREP SCREEN (MED CTR MEBANE ONLY): Streptococcus, Group A Screen (Direct): POSITIVE — AB

## 2014-11-28 MED ORDER — PENICILLIN G BENZATHINE 1200000 UNIT/2ML IM SUSP
1.2000 10*6.[IU] | Freq: Once | INTRAMUSCULAR | Status: AC
  Administered 2014-11-28: 1.2 10*6.[IU] via INTRAMUSCULAR
  Filled 2014-11-28: qty 2

## 2014-11-28 NOTE — ED Notes (Signed)
Pt. reports sore throat with occasional productive cough onset 2 days ago , denies fever or chills , airway intact /respirations unlabored .

## 2014-11-28 NOTE — Discharge Instructions (Signed)

## 2014-11-28 NOTE — ED Notes (Signed)
Pt c/o sore throat, bodyaches, and productive cough x 2 days. Reports son and his girlfriend have both had strep throat. Red tonsils noted with increased pain during swallowing.

## 2014-11-28 NOTE — ED Provider Notes (Signed)
CSN: [id number]     Arrival date & time 11/28/14  2120 History  This chart was scribed for non-physician practitioner, Lonia Skinner. Joylene Grapes, working with Rolland Porter, MD by Marica Otter, ED Scribe. This patient was seen in room TR10C/TR10C and the patient's care was started at 10:50 PM.   Chief Complaint  Patient presents with  . Sore Throat   The history is provided by the patient. No language interpreter was used.   PCP: Tawni Carnes, MD HPI Comments: Mallory Lin is a 44 y.o. female, with PMHx noted below, who presents to the Emergency Department complaining of sore throat with associated body aches, chills and productive cough onset 2 days ago. Pt confirms sick contacts stating her son had similar Sx recently. Pt denies any daily meds, fever, chronic health conditions, tobacco use, chronic EtOH use, or any medicinal allergies.   Past Medical History  Diagnosis Date  . Recurrent boils   . UTI (lower urinary tract infection)    Past Surgical History  Procedure Laterality Date  . Incision and drainage abscess anal    . Tubal ligation    . Incise and drain abcess      for hidradenitis  . Teeth extracted    . Hydradenitis excision Bilateral 06/06/2012    Procedure: EXCISION HYDRADENITIS AXILLA;  Surgeon: Velora Heckler, MD;  Location: [facility];  Service: General;  Laterality: Bilateral;   No family history on file. Social History  Substance Use Topics  . Smoking status: Never Smoker   . Smokeless tobacco: None  . Alcohol Use: No   OB History    No data available     Review of Systems  Constitutional: Positive for chills. Negative for fever.  HENT: Positive for sore throat.   Respiratory: Positive for cough.   Musculoskeletal: Positive for myalgias.  All other systems reviewed and are negative.  Allergies  Review of patient's allergies indicates no known allergies.  Home Medications   Prior to Admission medications   Medication Sig Start Date End  Date Taking? Authorizing Provider  cyclobenzaprine (FLEXERIL) 10 MG tablet Take 1 tablet (10 mg total) by mouth 3 (three) times daily as needed for muscle spasms. 10/04/14   Tommie Sams, DO  fluconazole (DIFLUCAN) 150 MG tablet Take 1 tablet (150 mg total) by mouth daily. Repeat dose in 3 days 10/10/14   Ozella Rocks, MD  HYDROcodone-acetaminophen (NORCO/VICODIN) 5-325 MG per tablet Take 2 tablets by mouth every 4 (four) hours as needed. 10/01/14   Elson Areas, PA-C  ibuprofen (ADVIL,MOTRIN) 800 MG tablet Take 1 tablet (800 mg total) by mouth 3 (three) times daily. 10/01/14   Elson Areas, PA-C  traMADol (ULTRAM) 50 MG tablet Take 1 tablet (50 mg total) by mouth every 8 (eight) hours as needed. 10/04/14   Tommie Sams, DO   Triage Vitals: BP 141/81 mmHg  Pulse 105  Temp(Src) 99.7 F (37.6 C) (Oral)  Resp 16  Wt 176 lb (79.833 kg)  SpO2 98%  LMP 07/04/2011 Physical Exam  Constitutional: She is oriented to person, place, and time. She appears well-developed and well-nourished. No distress.  HENT:  Head: Normocephalic and atraumatic.  Mouth/Throat: Uvula is midline and mucous membranes are normal. Posterior oropharyngeal erythema present.  Eyes: Conjunctivae and EOM are normal.  Cardiovascular: Normal rate.   Pulmonary/Chest: Effort normal. No respiratory distress.  Musculoskeletal: Normal range of motion.  Neurological: She is alert and oriented to person, place, and time.  Skin: Skin is warm and dry.  Psychiatric: She has a normal mood and affect. Her behavior is normal.  Nursing note and vitals reviewed.   ED Course  Procedures (including critical care time) DIAGNOSTIC STUDIES: Oxygen Saturation is 98% on RA, nl by my interpretation.    COORDINATION OF CARE: 10:52 PM: Discussed treatment plan which includes shot of penicillin with pt at bedside; patient verbalizes understanding and agrees with treatment plan.  Labs Review Labs Reviewed  RAPID STREP SCREEN (NOT AT [facility]) -  Abnormal; Notable for the following:    Streptococcus, Group A Screen (Direct) POSITIVE (*)    All other components within normal limits    Imaging Review No results found.    EKG Interpretation None      MDM   Final diagnoses:  Strep tonsillitis    Pt request pcn injection instead of pills. Pt given bicillian Follow up with Md for recheck if symptoms persist   Elson Areas, PA-C 11/29/14 0009  Rolland Porter, MD 12/07/14 2145

## 2014-12-16 ENCOUNTER — Encounter: Payer: Self-pay | Admitting: Family Medicine

## 2014-12-16 ENCOUNTER — Ambulatory Visit (INDEPENDENT_AMBULATORY_CARE_PROVIDER_SITE_OTHER): Payer: Medicaid Other | Admitting: Family Medicine

## 2014-12-16 VITALS — BP 130/90 | HR 78 | Temp 98.2°F | Ht 66.0 in | Wt 175.3 lb

## 2014-12-16 DIAGNOSIS — N898 Other specified noninflammatory disorders of vagina: Secondary | ICD-10-CM

## 2014-12-16 DIAGNOSIS — B3731 Acute candidiasis of vulva and vagina: Secondary | ICD-10-CM

## 2014-12-16 DIAGNOSIS — B373 Candidiasis of vulva and vagina: Secondary | ICD-10-CM | POA: Diagnosis not present

## 2014-12-16 LAB — POCT WET PREP (WET MOUNT): CLUE CELLS WET PREP WHIFF POC: NEGATIVE

## 2014-12-16 MED ORDER — FLUCONAZOLE 150 MG PO TABS: 150.0000 mg | ORAL_TABLET | Freq: Every day | ORAL | Status: DC

## 2014-12-16 NOTE — Progress Notes (Signed)
   Subjective:    Patient ID: [name], female    DOB: June 09, 1970, 44 y.o.   MRN: [id number]  HPI  Patient presents for Same Day Appointment  CC: vaginal itching  # Vaginitis:  Says it feels like last time she went to urgent care a few months ago and was diagnosed with yeast infection  Started 1 week ago  Recently treated with penicllin in ED 2 weeks ago for strep throat  Denies any discharge  Denies any burning with urination ROS: no fevers/chills, no nausea/vomtiing, no abdominal pain  Review of Systems   See HPI for ROS. All other systems reviewed and are negative.  Past medical history, surgical, family, and social history reviewed and updated in the EMR as appropriate.  Objective:  BP 130/90 mmHg  Pulse 78  Temp(Src) 98.2 F (36.8 C) (Oral)  Ht  (1.676 m)  Wt 175 lb 4.8 oz (79.516 kg)  BMI 28.31 kg/m2  LMP 07/04/2011 Vitals and nursing note reviewed  General: NAD Abdomen: soft, nontender, nondistended, normal bowel sounds GU: irritated appearance of introitus, no external discharge. Vaginal walls irritated and copious thick/chunky yellow-green discharge in vaginal vault. No blood noted.    Assessment & Plan:  Yeast vaginitis Exam consistent with yeast infection. Recent antibiotic use may be trigger. Wet prep with yeast, no BV or trich. Rx diflucan once with second dose 3 days after. F/u as needed.

## 2014-12-16 NOTE — Assessment & Plan Note (Addendum)
Exam consistent with yeast infection. Recent antibiotic use may be trigger. Wet prep with yeast, no BV or trich. Rx diflucan once with second dose 3 days after. F/u as needed.

## 2014-12-22 ENCOUNTER — Ambulatory Visit: Payer: Medicaid Other | Admitting: Family Medicine

## 2015-01-10 ENCOUNTER — Ambulatory Visit (INDEPENDENT_AMBULATORY_CARE_PROVIDER_SITE_OTHER): Payer: Medicaid Other | Admitting: *Deleted

## 2015-01-10 DIAGNOSIS — Z111 Encounter for screening for respiratory tuberculosis: Secondary | ICD-10-CM

## 2015-01-10 NOTE — Progress Notes (Signed)
   PPD placed Left Forearm.  Pt to return 01/12/2015 for reading.  Pt tolerated intradermal injection. Clovis Pu, RN

## 2015-01-13 ENCOUNTER — Encounter: Payer: Self-pay | Admitting: *Deleted

## 2015-01-13 ENCOUNTER — Ambulatory Visit (INDEPENDENT_AMBULATORY_CARE_PROVIDER_SITE_OTHER): Payer: Medicaid Other | Admitting: *Deleted

## 2015-01-13 DIAGNOSIS — Z7689 Persons encountering health services in other specified circumstances: Secondary | ICD-10-CM

## 2015-01-13 DIAGNOSIS — Z111 Encounter for screening for respiratory tuberculosis: Secondary | ICD-10-CM

## 2015-01-13 LAB — TB SKIN TEST
Induration: 0 mm
TB Skin Test: NEGATIVE

## 2015-01-13 NOTE — Progress Notes (Signed)
   PPD Reading Note PPD read and results entered in EpicCare. Result: 0 mm induration. Interpretation: Negative If test not read within 48-72 hours of initial placement, patient advised to repeat in other arm 1-3 weeks after this test. Allergic reaction: no  Martin, Tamika L, RN  

## 2015-01-24 ENCOUNTER — Encounter: Payer: Medicaid Other | Admitting: Family Medicine

## 2015-04-20 ENCOUNTER — Other Ambulatory Visit (HOSPITAL_COMMUNITY)
Admission: RE | Admit: 2015-04-20 | Discharge: 2015-04-20 | Disposition: A | Discharge status: Home / Self Care | Payer: Medicaid Other | Source: Ambulatory Visit | Attending: Family Medicine | Admitting: Family Medicine

## 2015-04-20 ENCOUNTER — Encounter: Payer: Self-pay | Admitting: Family Medicine

## 2015-04-20 ENCOUNTER — Ambulatory Visit (INDEPENDENT_AMBULATORY_CARE_PROVIDER_SITE_OTHER): Payer: Medicaid Other | Admitting: Family Medicine

## 2015-04-20 VITALS — BP 132/80 | HR 65 | Temp 98.3°F | Wt 173.7 lb

## 2015-04-20 DIAGNOSIS — Z113 Encounter for screening for infections with a predominantly sexual mode of transmission: Secondary | ICD-10-CM | POA: Diagnosis present

## 2015-04-20 DIAGNOSIS — L298 Other pruritus: Secondary | ICD-10-CM | POA: Diagnosis present

## 2015-04-20 DIAGNOSIS — N898 Other specified noninflammatory disorders of vagina: Secondary | ICD-10-CM | POA: Diagnosis not present

## 2015-04-20 LAB — POCT WET PREP (WET MOUNT)
Clue Cells Wet Prep Whiff POC: NEGATIVE
WBC, Wet Prep HPF POC: >20

## 2015-04-20 NOTE — Patient Instructions (Signed)
Thanks for coming in today.   The initial results were negative. I will call you with the rest of your results whenever they come back.   You should go back to using your normal underwear in the meantime.   If you have any other concerns or develop any other symptoms, we are happy to see you back.    Thanks for letting us take care of you.   Sincerely, Devota Pacealeb Malakye Nolden, MD Family Medicine - PGY 2

## 2015-04-20 NOTE — Progress Notes (Signed)
Patient ID: Unk [name], female   DOB: 06/25/1970, 45 y.o.   MRN: [id number]   [facility] Mallory Lin G Mallory Dirr, MD Phone: [phone]  Subjective:   VAGINAL DISCHARGE  Having vaginal discharge for 2 days. Medications tried: none Discharge consistency: thin Discharge color: white Recent antibiotic use: none Sex in last month: yes with the same partner, both say they are manogomous.  Possible STD exposure: unsure.   Symptoms Fever: none Dysuria:none Vaginal bleeding: none.  Abdomen or Pelvic pain: none. Back pain: none.  Genital sores or ulcers:none Rash: none Pain during sex: none Missed menstrual period: no.   ROS see HPI Smoking Status noted    All relevant systems were reviewed and were negative unless otherwise noted in the HPI  Past Medical History Reviewed problem list.  Medications- reviewed and updated Current Outpatient Prescriptions  Medication Sig Dispense Refill  . cyclobenzaprine (FLEXERIL) 10 MG tablet Take 1 tablet (10 mg total) by mouth 3 (three) times daily as needed for muscle spasms. 30 tablet 0  . fluconazole (DIFLUCAN) 150 MG tablet Take 1 tablet (150 mg total) by mouth daily. Repeat dose in 3 days 2 tablet 0  . HYDROcodone-acetaminophen (NORCO/VICODIN) 5-325 MG per tablet Take 2 tablets by mouth every 4 (four) hours as needed. 10 tablet 0  . ibuprofen (ADVIL,MOTRIN) 800 MG tablet Take 1 tablet (800 mg total) by mouth 3 (three) times daily. 21 tablet 0  . traMADol (ULTRAM) 50 MG tablet Take 1 tablet (50 mg total) by mouth every 8 (eight) hours as needed. 30 tablet 0   No current facility-administered medications for this visit.   Chief complaint-noted No additions to family history Social history- patient is a non smoker  Objective: BP 132/80 mmHg  Pulse 65  Temp(Src) 98.3 F (36.8 C) (Oral)  Wt 173 lb 11.2 oz (78.79 kg)  LMP 07/04/2011 Gen: NAD, alert, cooperative with exam HEENT: NCAT, EOMI, PERRL Neck: FROM,  supple CV: RRR, good S1/S2, no murmur Resp: CTABL, no wheezes, non-labored Abd: SNTND, BS present, no guarding or organomegaly G/U:  Pt. With small amount of whitish discharge, no cervical lesions, no vaginal wall lesions. No CMT. No abdominal tenderness.  Ext: No edema, warm, normal tone, moves UE/LE spontaneously Neuro: Alert and oriented, No gross deficits Skin: no rashes no lesions  Assessment/Plan:  Discharge - wet prep negative here, symptoms are not overly concerning for acute gonococcal or chlamydial infection.  - will f/u results and call in antibiotics if needed.  - pt. Aware.  - She felt that it may be related to new underwear. She has gotten rid of these.  - Benadryl prn for itching. No douching, vaginal hygiene discussed.  - follow up prn. Return precautions reviewed.

## 2015-04-20 NOTE — Addendum Note (Signed)
Addended by: Jennette BillBUSICK, Atom Solivan L on: 04/20/2015 03:55 PM   Modules accepted: Orders

## 2015-04-21 LAB — CERVICOVAGINAL ANCILLARY ONLY
Chlamydia: NEGATIVE
Neisseria Gonorrhea: NEGATIVE

## 2015-04-25 ENCOUNTER — Encounter: Payer: Medicaid Other | Admitting: Family Medicine

## 2015-04-27 ENCOUNTER — Encounter: Payer: Medicaid Other | Admitting: Family Medicine

## 2015-04-27 ENCOUNTER — Telehealth: Payer: Self-pay | Admitting: Family Medicine

## 2015-04-27 NOTE — Telephone Encounter (Signed)
Called to inform of negative test results.   CGM MD

## 2015-05-03 ENCOUNTER — Ambulatory Visit (INDEPENDENT_AMBULATORY_CARE_PROVIDER_SITE_OTHER): Payer: Medicaid Other | Admitting: Family Medicine

## 2015-05-03 ENCOUNTER — Encounter: Payer: Self-pay | Admitting: Family Medicine

## 2015-05-03 VITALS — BP 133/67 | HR 73 | Temp 97.9°F | Ht 66.0 in | Wt 177.0 lb

## 2015-05-03 DIAGNOSIS — Z0000 Encounter for general adult medical examination without abnormal findings: Secondary | ICD-10-CM

## 2015-05-03 DIAGNOSIS — L732 Hidradenitis suppurativa: Secondary | ICD-10-CM

## 2015-05-03 DIAGNOSIS — Z83438 Family history of other disorder of lipoprotein metabolism and other lipidemia: Secondary | ICD-10-CM

## 2015-05-03 DIAGNOSIS — Z8349 Family history of other endocrine, nutritional and metabolic diseases: Secondary | ICD-10-CM | POA: Diagnosis not present

## 2015-05-03 LAB — LIPID PANEL
CHOL/HDL RATIO: 3.1 ratio (ref <=5.0)
Cholesterol: 182 mg/dL (ref 125–200)
HDL: 58 mg/dL (ref >=46)
LDL Cholesterol: 98 mg/dL (ref <130)
Triglycerides: 129 mg/dL (ref <150)
VLDL: 26 mg/dL (ref <30)

## 2015-05-03 MED ORDER — CLINDAMYCIN PHOSPHATE 1 % EX SOLN: CUTANEOUS | Status: DC

## 2015-05-03 NOTE — Assessment & Plan Note (Signed)
Rx for 1% clindamycin solution. Discussed at first sign of flare doing cleansing baths rather than immediately starting clindamycin. Follow up as needed.

## 2015-05-03 NOTE — Patient Instructions (Addendum)
Diet: Avoid processed foods, junk food, sodas. Goal should be at least half of your plate being a vegetable (green, leafy tend to be the healthiest).   Exercise: Get at least 150 minutes of moderate aerobic activity or 75 minutes of vigorous aerobic activity a week, or a combination of moderate and vigorous activity.  Screening: Mammogram recommended starting at age 45 Colonoscopy recommended starting at age 39  If you change your mind about the flu shot please call the clinic.

## 2015-05-03 NOTE — Assessment & Plan Note (Addendum)
Not due for pap smear. She is not at increased risk so recommended mammograms starting at age 45. Counseled regarding diet/exercise. Patient declined flu shot. Follow up 1 year or sooner if needed.

## 2015-05-03 NOTE — Progress Notes (Signed)
   Subjective:    Patient ID: Mallory Lin, female    DOB: 10-20-1970, 45 y.o.   MRN: [id number]  HPI  CC: Annual  # Annual:  Pap smear due December 2017  Not getting mammograms  No family history of early breast cancer, colon cancer  Walk every morning 1-2 miles.  Does not smoke or drink alcohol  # Hidradenitis  Primarily in armpits, has had surgery for this  Requests refill on clindamycin solution for when she gets flares  Social Hx: never smoker  Review of Systems   ROS sheet reviewed and is negative for all systems.  Past medical history, surgical, family, and social history reviewed and updated in the EMR as appropriate. Objective:  BP 133/67 mmHg  Pulse 73  Temp(Src) 97.9 F (36.6 C) (Oral)  Ht  (1.676 m)  Wt 177 lb (80.287 kg)  BMI 28.58 kg/m2  LMP 07/04/2011 Vitals and nursing note reviewed  General: NAD HEENT: PERRL, EOMI, normal conjunctiva and sclera. Normal oral mucosa and posterior pharynx CV: RRR, normal heart sounds, no murmurs, rubs or gallop.  Resp: clear to auscultation bilaterally, normal effort Abdomen: soft, nontender, nondistended, normal bowel sounds  Extremities: normal muscle bulk and tone Neuro: alert and oriented, no deficits noted  Assessment & Plan:  Annual physical exam Not due for pap smear. She is not at increased risk so recommended mammograms starting at age 70. Counseled regarding diet/exercise. Follow up 1 year or sooner if needed.  Hidradenitis suppurativa Rx for 1% clindamycin solution. Discussed at first sign of flare doing cleansing baths rather than immediately starting clindamycin. Follow up as needed.

## 2015-05-05 ENCOUNTER — Telehealth: Payer: Self-pay | Admitting: Family Medicine

## 2015-05-05 NOTE — Telephone Encounter (Signed)
Called and informed her of normal lipid panel. Patient had no further questions. -Dr. Waynetta Sandy

## 2015-05-18 ENCOUNTER — Ambulatory Visit (INDEPENDENT_AMBULATORY_CARE_PROVIDER_SITE_OTHER): Payer: Medicaid Other | Admitting: Family Medicine

## 2015-05-18 ENCOUNTER — Encounter: Payer: Self-pay | Admitting: Family Medicine

## 2015-05-18 VITALS — BP 138/69 | HR 63 | Temp 97.7°F | Wt 176.0 lb

## 2015-05-18 DIAGNOSIS — N898 Other specified noninflammatory disorders of vagina: Secondary | ICD-10-CM

## 2015-05-18 DIAGNOSIS — B373 Candidiasis of vulva and vagina: Secondary | ICD-10-CM | POA: Diagnosis not present

## 2015-05-18 DIAGNOSIS — B3731 Acute candidiasis of vulva and vagina: Secondary | ICD-10-CM

## 2015-05-18 LAB — POCT WET PREP (WET MOUNT): CLUE CELLS WET PREP WHIFF POC: NEGATIVE

## 2015-05-18 MED ORDER — FLUCONAZOLE 150 MG PO TABS: 150.0000 mg | ORAL_TABLET | Freq: Every day | ORAL | Status: DC

## 2015-05-18 NOTE — Progress Notes (Signed)
   Subjective:    Patient ID: Mallory Lin, female    DOB: April 26, 1970, 45 y.o.   MRN: [id number]  HPI  Patient presents for Same Day Appointment  CC: vaginal irritation  # Vaginal itching:  Has been going on for about a month, came in first week of January and had negative wet prep. Used OTC yeast cream with some improvement, after finishing the itchiness came back and tried the cream again but didn't help  Discharge is only minimal  Itchiness is both "inside and out" ROS: no fevers, no chills, no abdominal pain  Social Hx: never smoker  Review of Systems   See HPI for ROS.   Past medical history, surgical, family, and social history reviewed and updated in the EMR as appropriate.  Objective:  BP 138/69 mmHg  Pulse 63  Temp(Src) 97.7 F (36.5 C) (Oral)  Wt 176 lb (79.833 kg)  LMP 07/04/2011 Vitals and nursing note reviewed  General: no apparent distress  GU: exam done with chaperone in room. Normal external genitalia, on the left labia majora there is a single area of skin scrap with overlying scab (she states she scratched this area). Normal appearing vaginal mucosa. Normal appearing cervix. There is a moderate amount of both thin and chunky white/light green discharge.   Assessment & Plan:   1. Vaginal discharge Will treat as yeast infection with negative wet prep (noted to have many bacteria) -- if not resolved may consider short course of doxycycline for vaginal bacterial overgrowth. Did recommend trial of probiotic. Follow up as needed  - POCT Wet Prep (Wet Mount) - fluconazole (DIFLUCAN) 150 MG tablet; Take 1 tablet (150 mg total) by mouth daily. Repeat dose in 3 days  Dispense: 2 tablet; Refill: 0

## 2015-05-18 NOTE — Patient Instructions (Addendum)
 $  20 at [facility]  If after the 2 diflucan tablets you are still having issues please call the clinic and let us know.

## 2015-06-22 ENCOUNTER — Telehealth: Payer: Self-pay | Admitting: Family Medicine

## 2015-06-22 DIAGNOSIS — B3731 Acute candidiasis of vulva and vagina: Secondary | ICD-10-CM

## 2015-06-22 DIAGNOSIS — B373 Candidiasis of vulva and vagina: Secondary | ICD-10-CM

## 2015-06-22 MED ORDER — FLUCONAZOLE 150 MG PO TABS: 150.0000 mg | ORAL_TABLET | Freq: Every day | ORAL | Status: DC

## 2015-06-22 NOTE — Telephone Encounter (Signed)
Continues to have yeast infections. Would like to have the 3 day pills called in. Pt lives in [place] and doesn't want to drive here so much. Uses [facility] on [address]

## 2015-06-22 NOTE — Telephone Encounter (Signed)
Refill sent in for diflucan. If she has issues after taking the diflucan she should be seen to have an exam and test done.

## 2015-07-18 ENCOUNTER — Telehealth: Payer: Self-pay | Admitting: Family Medicine

## 2015-07-18 DIAGNOSIS — B3731 Acute candidiasis of vulva and vagina: Secondary | ICD-10-CM

## 2015-07-18 DIAGNOSIS — B373 Candidiasis of vulva and vagina: Secondary | ICD-10-CM

## 2015-07-18 NOTE — Telephone Encounter (Signed)
Pt called because she has a yeast infection and would like the 3 day pills called in for this. jw

## 2015-07-18 NOTE — Telephone Encounter (Signed)
Will forward to MD to see if this can refilled.  Patient was advised with last refill that she may need an office visit.  Jazmin Hartsell,CMA

## 2015-07-19 MED ORDER — FLUCONAZOLE 150 MG PO TABS: 150.0000 mg | ORAL_TABLET | Freq: Every day | ORAL | Status: DC

## 2015-07-19 NOTE — Telephone Encounter (Signed)
Refill sent in. If it doesn't resolve her problem she will need to be seen.

## 2015-07-19 NOTE — Telephone Encounter (Signed)
Patient is aware of this and will call if symptoms do not go away. Mallory Lin,CMA

## 2015-10-26 ENCOUNTER — Other Ambulatory Visit (HOSPITAL_COMMUNITY)
Admission: RE | Admit: 2015-10-26 | Discharge: 2015-10-26 | Disposition: A | Discharge status: Home / Self Care | Payer: Medicaid Other | Source: Ambulatory Visit | Attending: Family Medicine | Admitting: Family Medicine

## 2015-10-26 ENCOUNTER — Ambulatory Visit (INDEPENDENT_AMBULATORY_CARE_PROVIDER_SITE_OTHER): Payer: Medicaid Other | Admitting: Family Medicine

## 2015-10-26 VITALS — BP 132/86 | HR 67 | Temp 97.7°F | Wt 179.6 lb

## 2015-10-26 DIAGNOSIS — Z1151 Encounter for screening for human papillomavirus (HPV): Secondary | ICD-10-CM | POA: Insufficient documentation

## 2015-10-26 DIAGNOSIS — Z01419 Encounter for gynecological examination (general) (routine) without abnormal findings: Secondary | ICD-10-CM | POA: Diagnosis present

## 2015-10-26 DIAGNOSIS — B373 Candidiasis of vulva and vagina: Secondary | ICD-10-CM | POA: Diagnosis not present

## 2015-10-26 DIAGNOSIS — Z124 Encounter for screening for malignant neoplasm of cervix: Secondary | ICD-10-CM

## 2015-10-26 DIAGNOSIS — Z113 Encounter for screening for infections with a predominantly sexual mode of transmission: Secondary | ICD-10-CM | POA: Insufficient documentation

## 2015-10-26 DIAGNOSIS — B3731 Acute candidiasis of vulva and vagina: Secondary | ICD-10-CM

## 2015-10-26 LAB — POCT WET PREP (WET MOUNT): CLUE CELLS WET PREP WHIFF POC: NEGATIVE

## 2015-10-26 MED ORDER — FLUCONAZOLE 150 MG PO TABS: 150.0000 mg | ORAL_TABLET | Freq: Every day | ORAL | Status: DC

## 2015-10-26 NOTE — Patient Instructions (Addendum)
I performed your pap smear today.  These results should be back in about 1 week.  I will send a copy to you.  If there are any abnormalities, I will call you.  I have sent in Diflucan.  Take 1 tablet now.  You may repeat in 3 days if you continue to have itching.    Healthy vaginal hygiene practices   -  Avoid sleeper pajamas. Nightgowns allow air to circulate.  Sleep without underpants whenever possible.  -  Wear cotton underpants during the day. Double-rinse underwear after washing to avoid residual irritants. Do not use fabric softeners for underwear and swimsuits.  - Avoid tights, leotards, leggings, "skinny" jeans, and other tight-fitting clothing. Skirts and loose-fitting pants allow air to circulate.  - Avoid pantyliners.  Instead use tampons or cotton pads.  - Daily warm bathing is helpful:     - Soak in clean water (no soap) for 10 to 15 minutes. Adding vinegar or baking soda to the water has not been specifically studied and may not be better than clean water alone.      - Use soap to wash regions other than the genital area just before getting out of the tub. Limit use of any soap on genital areas. Use fragance-free soaps.     - Rinse the genital area well and gently pat dry.  Don't rub.  Hair dryer to assist with drying can be used only if on cool setting.     - Do not use bubble baths or perfumed soaps.  - Do not use any feminine sprays, douches or powders.  These contain chemicals that will irritate the skin.  - If the genital area is tender or swollen, cool compresses may relieve the discomfort. Unscented wet wipes can be used instead of toilet paper for wiping.   - Emollients, such as Vaseline, may help protect skin and can be applied to the irritated area.  - Always remember to wipe front-to-back after bowel movements. Pat dry after urination.  - Do not sit in wet swimsuits for long periods of time after swimming

## 2015-10-26 NOTE — Progress Notes (Signed)
   Subjective: CC: vaginal discharge [id number]:Mallory Lin is a 45 y.o. female presenting to clinic today for same day appointment. PCP: Danella MaiersAsiyah Z Mikell, MD Concerns today include:  1. Vaginal Itching  Patient reports that itching started 2 day ago.  She denies vaginal discharge or vaginal odors.  She denies abnormal vaginal bleeding, dysuria, hematuria, pelvic pain, dyspareunia, nausea, vomiting, fevers.   She has used not used any OTC meds.  Denies history of STIs.  She is not currently sexually active and does not use condoms.  Contraception: abstinence. Agreeable to having pap smear performed today as well.  Social History Reviewed: married and resides with husband FamHx and MedHx reviewed.  Please see EMR. Health Maintenance: pap due  ROS: Per HPI  Objective: Office vital signs reviewed. BP 132/86 mmHg  Pulse 67  Temp(Src) 97.7 F (36.5 C) (Oral)  Wt 179 lb 9.6 oz (81.466 kg)  SpO2 100%  LMP 07/04/2011  Physical Examination:  General: Awake, alert, well nourished, No acute distress GI: soft, non-tender, non-distended, bowel sounds present x4, no masses GU: external vaginal tissue normal in appearanve, cervix retroverted, no punctate lesions on cervix appreciated, scant white discharge from cervical os, no bleeding, no cervical motion tenderness, no abdominal/ adnexal masses  Results for orders placed or performed in visit on 10/26/15 (from the past 24 hour(s))  POCT Wet Prep Mellody Drown(Wet Mount)     Status: Abnormal   Collection Time: 10/26/15 12:06 PM  Result Value Ref Range   Source Wet Prep POC VAG    WBC, Wet Prep HPF POC 5-10    Bacteria Wet Prep HPF POC Many (A) None, Few, Too numerous to count   Clue Cells Wet Prep HPF POC None None, Too numerous to count   Clue Cells Wet Prep Whiff POC Negative Whiff    Yeast Wet Prep HPF POC Moderate    Trichomonas Wet Prep HPF POC NONE      Assessment/ Plan: 45 y.o. female   1. Vaginal yeast infection, appreciated on wet prep. -  Handout on vaginal health provided - POCT Wet Prep (Wet Mount) - fluconazole (DIFLUCAN) 150 MG tablet; Take 1 tablet (150 mg total) by mouth daily. Repeat dose in 3 days  Dispense: 2 tablet; Refill: 0  2. Screening for cervical cancer - GC/CT and HPV cotesting ordered with pap smear. - Cytology - PAP  Follow up with PCP as scheduled for routine care/ prn  Raliegh IpAshly M Leaf [employer], DO PGY-3, [facility]

## 2015-10-27 ENCOUNTER — Encounter: Payer: Self-pay | Admitting: Family Medicine

## 2015-10-27 ENCOUNTER — Telehealth: Payer: Self-pay | Admitting: Family Medicine

## 2015-10-27 LAB — CYTOLOGY - PAP

## 2015-10-27 NOTE — Telephone Encounter (Signed)
Called to discuss pap smear.  Pap smear normal, negative HPV.  Will send letter to patient's home with results.  Masin Shatto M. Nadine CountsGottschalk, DO PGY-3, [facility]

## 2016-05-28 ENCOUNTER — Encounter (HOSPITAL_COMMUNITY): Payer: Self-pay | Admitting: *Deleted

## 2016-05-28 ENCOUNTER — Emergency Department (HOSPITAL_COMMUNITY)
Admission: EM | Admit: 2016-05-28 | Discharge: 2016-05-29 | Disposition: A | Discharge status: Home / Self Care | Payer: Medicaid Other | Attending: Emergency Medicine | Admitting: Emergency Medicine

## 2016-05-28 DIAGNOSIS — K529 Noninfective gastroenteritis and colitis, unspecified: Secondary | ICD-10-CM | POA: Diagnosis not present

## 2016-05-28 DIAGNOSIS — R197 Diarrhea, unspecified: Secondary | ICD-10-CM | POA: Diagnosis present

## 2016-05-28 MED ORDER — ACETAMINOPHEN 325 MG PO TABS
ORAL_TABLET | ORAL | Status: AC
  Filled 2016-05-28: qty 2

## 2016-05-28 MED ORDER — ACETAMINOPHEN 325 MG PO TABS: 650.0000 mg | ORAL_TABLET | Freq: Once | ORAL | Status: DC

## 2016-05-28 MED ORDER — ONDANSETRON 4 MG PO TBDP
4.0000 mg | ORAL_TABLET | Freq: Once | ORAL | Status: AC
  Administered 2016-05-28: 4 mg via ORAL

## 2016-05-28 MED ORDER — ONDANSETRON 4 MG PO TBDP: 4.0000 mg | ORAL_TABLET | Freq: Three times a day (TID) | ORAL | 0 refills | Status: DC | PRN

## 2016-05-28 MED ORDER — ONDANSETRON 4 MG PO TBDP
ORAL_TABLET | ORAL | Status: AC
  Filled 2016-05-28: qty 1

## 2016-05-28 NOTE — ED Provider Notes (Signed)
[facility]-EMERGENCY DEPT Provider Note   CSN: [id number] Arrival date & time: 05/28/16  1803     History   Chief Complaint Chief Complaint  Patient presents with  . Generalized Body Aches  . Diarrhea  . Headache    HPI Mallory Lin is a 46 y.o. female.  Patient presents with complaint of generalized body aches, N, V, D x 2 days. No fever. She reports family members at home with similar symptoms. No significant congestion, sore throat, cough. She has generalized abdominal cramping but no ongoing pain. No hematemesis or bloody stools.    The history is provided by the patient. No language interpreter was used.    Past Medical History:  Diagnosis Date  . Recurrent boils   . UTI (lower urinary tract infection)     Patient Active Problem List   Diagnosis Date Noted  . Yeast vaginitis 12/16/2014  . Mild anemia 03/26/2013  . Hematuria 03/26/2013  . Dysuria 03/10/2013  . Hot flashes 03/10/2013  . Overweight (BMI 25.0-29.9) 01/15/2013  . Annual physical exam 01/15/2013  . Hidradenitis suppurativa 11/24/2010  . BOILS, RECURRENT 01/12/2008    Past Surgical History:  Procedure Laterality Date  . HYDRADENITIS EXCISION Bilateral 06/06/2012   Procedure: EXCISION HYDRADENITIS AXILLA;  Surgeon: Velora Heckler, MD;  Location: [facility];  Service: General;  Laterality: Bilateral;  . INCISE AND DRAIN ABCESS     for hidradenitis  . INCISION AND DRAINAGE ABSCESS ANAL    . teeth extracted    . TUBAL LIGATION      OB History    No data available       Home Medications    Prior to Admission medications   Medication Sig Start Date End Date Taking? Authorizing Provider  fluconazole (DIFLUCAN) 150 MG tablet Take 1 tablet (150 mg total) by mouth daily. Repeat dose in 3 days 10/26/15   Raliegh Ip, DO    Family History No family history on file.  Social History Social History  Substance Use Topics  . Smoking status: Never Smoker  . Smokeless tobacco:  Never Used  . Alcohol use No     Allergies   Patient has no known allergies.   Review of Systems Review of Systems  Constitutional: Negative for chills and fever.  HENT: Negative.  Negative for congestion and sore throat.   Respiratory: Negative.  Negative for shortness of breath.   Cardiovascular: Negative.   Gastrointestinal: Positive for abdominal pain, diarrhea and vomiting. Negative for abdominal distention.  Genitourinary: Negative.  Negative for decreased urine volume and dysuria.  Musculoskeletal: Positive for myalgias.  Skin: Negative.   Neurological: Negative.  Negative for syncope and weakness.     Physical Exam Updated Vital Signs BP 118/67   Pulse 86   Temp 100 F (37.8 C) (Oral)   Resp 18   Wt 77.1 kg   LMP 07/04/2011   SpO2 100%   BMI 27.44 kg/m   Physical Exam  Constitutional: She is oriented to person, place, and time. She appears well-developed and well-nourished.  HENT:  Head: Normocephalic.  Neck: Normal range of motion. Neck supple.  Cardiovascular: Normal rate and regular rhythm.   Pulmonary/Chest: Effort normal and breath sounds normal. She has no wheezes. She has no rales.  Abdominal: Soft. Bowel sounds are normal. There is no tenderness. There is no rebound and no guarding.  Musculoskeletal: Normal range of motion.  Neurological: She is alert and oriented to person, place, and time.  Skin:  Skin is warm and dry. No rash noted.  Psychiatric: She has a normal mood and affect.     ED Treatments / Results  Labs (all labs ordered are listed, but only abnormal results are displayed) Labs Reviewed  COMPREHENSIVE METABOLIC PANEL  CBC     EKG  EKG Interpretation None       Radiology No results found.  Procedures Procedures (including critical care time)  Medications Ordered in ED Medications  acetaminophen (TYLENOL) tablet 650 mg (650 mg Oral Not Given 05/28/16 1832)  acetaminophen (TYLENOL) 325 MG tablet (not administered)    ondansetron (ZOFRAN-ODT) 4 MG disintegrating tablet (not administered)  ondansetron (ZOFRAN-ODT) disintegrating tablet 4 mg (4 mg Oral Given 05/28/16 1836)     Initial Impression / Assessment and Plan / ED Course  I have reviewed the triage vital signs and the nursing notes.  Pertinent labs & imaging results that were available during my care of the patient were reviewed by me and considered in my medical decision making (see chart for details).     Patient with N, V, D and generalized body aches. No URI symptoms. Family members with similar symptoms. VSS. She received Zofran here and is now tolerating PO fluids without nausea.   Symptoms likely viral. She is well appearing and no longer vomiting. She is felt appropriate for discharge home with Rx Zofran.  Final Clinical Impressions(s) / ED Diagnoses   Final diagnoses:  None   1. Gastroenteritis   New Prescriptions New Prescriptions   No medications on file     Elpidio AnisShari Claxton Levitz, Cordelia Poche-C 05/28/16 2339    Gwyneth SproutWhitney Plunkett, MD 05/29/16 [phone]

## 2016-05-28 NOTE — ED Triage Notes (Signed)
Pt states body aches, vomiting, diarrhea, headaches all since Sat.

## 2016-05-29 NOTE — ED Notes (Addendum)
Pt verbalized understanding of discharge instructions. No signature pad available.

## 2016-08-28 ENCOUNTER — Ambulatory Visit (HOSPITAL_COMMUNITY)
Admission: EM | Admit: 2016-08-28 | Discharge: 2016-08-28 | Disposition: A | Discharge status: Home / Self Care | Payer: Medicaid Other | Attending: Internal Medicine | Admitting: Internal Medicine

## 2016-08-28 ENCOUNTER — Encounter (HOSPITAL_COMMUNITY): Payer: Self-pay | Admitting: Family Medicine

## 2016-08-28 DIAGNOSIS — R112 Nausea with vomiting, unspecified: Secondary | ICD-10-CM

## 2016-08-28 DIAGNOSIS — R1013 Epigastric pain: Secondary | ICD-10-CM | POA: Diagnosis not present

## 2016-08-28 DIAGNOSIS — R197 Diarrhea, unspecified: Secondary | ICD-10-CM

## 2016-08-28 DIAGNOSIS — A084 Viral intestinal infection, unspecified: Secondary | ICD-10-CM | POA: Diagnosis not present

## 2016-08-28 MED ORDER — ONDANSETRON HCL 4 MG PO TABS: 4.0000 mg | ORAL_TABLET | Freq: Four times a day (QID) | ORAL | 0 refills | Status: DC

## 2016-08-28 MED ORDER — HYOSCYAMINE SULFATE SL 0.125 MG SL SUBL: SUBLINGUAL_TABLET | SUBLINGUAL | 0 refills | Status: DC

## 2016-08-28 NOTE — Discharge Instructions (Signed)
Take Zofran as directed for nausea and vomiting. Then start drinking clear liquids such as water and Pedialyte small amounts at a time and frequently such as every 15 minutes to start out with. Gradually increase the amount that you drink over the next few hours. No solid foods or dairy products for the next day. Then start out with crackers and toast. If tolerating this slowly advance diet. You may take Imodium for diarrhea if you are having within 3 stools and 6 hour period of time. Do not take more than 2 in a day and do not stop the diarrhea with medicine. The hyoscyamine is for abdominal cramps and pain. Take as directed. This can also slow down your diarrhea. If it does then do not take the Imodium. If he get worse, develop fevers, continued vomiting or having worse diarrhea or blood in your stool culture to the emergency department.

## 2016-08-28 NOTE — ED Triage Notes (Signed)
Pt here for generalized abd pain,N,V,D since this am about 4 am.

## 2016-08-28 NOTE — ED Provider Notes (Signed)
CSN: [id number]     Arrival date & time 08/28/16  1245 History   First MD Initiated Contact with Patient 08/28/16 1359     Chief Complaint  Patient presents with  . Abdominal Pain  . Emesis  . Diarrhea   (Consider location/radiation/quality/duration/timing/severity/associated sxs/prior Treatment) 46 year old female awoke this morning at 4 AM with nausea, vomiting and diarrhea. She also has pain across the mid abdomen and in the epigastrium. She describes a crampy type pain. She has vomited about 3 times and she has had a few more episodes of diarrhea without bleeding. LMP 10 years ago.  BTL.      Past Medical History:  Diagnosis Date  . Recurrent boils   . UTI (lower urinary tract infection)    Past Surgical History:  Procedure Laterality Date  . HYDRADENITIS EXCISION Bilateral 06/06/2012   Procedure: EXCISION HYDRADENITIS AXILLA;  Surgeon: Velora Heckler, MD;  Location: [facility];  Service: General;  Laterality: Bilateral;  . INCISE AND DRAIN ABCESS     for hidradenitis  . INCISION AND DRAINAGE ABSCESS ANAL    . teeth extracted    . TUBAL LIGATION     History reviewed. No pertinent family history. Social History  Substance Use Topics  . Smoking status: Never Smoker  . Smokeless tobacco: Never Used  . Alcohol use No   OB History    No data available     Review of Systems  Constitutional: Positive for activity change and fatigue. Negative for fever.  Respiratory: Negative.   Gastrointestinal: Positive for abdominal pain, diarrhea, nausea and vomiting.  Genitourinary: Negative.   Musculoskeletal: Negative.   Skin: Negative.   Neurological: Negative.   All other systems reviewed and are negative.   Allergies  Patient has no known allergies.  Home Medications   Prior to Admission medications   Medication Sig Start Date End Date Taking? Authorizing Provider  Hyoscyamine Sulfate SL (LEVSIN/SL) 0.125 MG SUBL 1 tablet under the tongue every 6 hours  as needed for abdominal pain and cramping. 08/28/16   Hayden Rasmussen, NP  ondansetron (ZOFRAN) 4 MG tablet Take 1 tablet (4 mg total) by mouth every 6 (six) hours. 08/28/16   Hayden Rasmussen, NP   Meds Ordered and Administered this Visit  Medications - No data to display  BP 131/71   Pulse 86   Temp 98.2 F (36.8 C)   Resp 18   LMP 07/04/2011   SpO2 100%  No data found.   Physical Exam  Constitutional: She is oriented to person, place, and time. She appears well-developed and well-nourished. No distress.  Eyes: EOM are normal.  Neck: Normal range of motion. Neck supple.  Cardiovascular: Normal rate, regular rhythm, normal heart sounds and intact distal pulses.   Pulmonary/Chest: Effort normal and breath sounds normal. No respiratory distress. She has no wheezes. She has no rales.  Abdominal: Soft.  Hypoactive bowel sounds. Tenderness in the epigastrium and across the mid abdomen. No rebound, guarding or mass.  Musculoskeletal: Normal range of motion. She exhibits no edema.  Neurological: She is alert and oriented to person, place, and time. She exhibits normal muscle tone.  Skin: Skin is warm and dry.  Psychiatric: She has a normal mood and affect.  Nursing note and vitals reviewed.   Urgent Care Course     Procedures (including critical care time)  Labs Review Labs Reviewed - No data to display  Imaging Review No results found.   Visual Acuity Review  Right  Eye Distance:   Left Eye Distance:   Bilateral Distance:    Right Eye Near:   Left Eye Near:    Bilateral Near:         MDM   1. Nausea vomiting and diarrhea   2. Viral gastroenteritis   3. Acute epigastric pain    Take Zofran as directed for nausea and vomiting. Then start drinking clear liquids such as water and Pedialyte small amounts at a time and frequently such as every 15 minutes to start out with. Gradually increase the amount that you drink over the next few hours. No solid foods or dairy products  for the next day. Then start out with crackers and toast. If tolerating this slowly advance diet. You may take Imodium for diarrhea if you are having within 3 stools and 6 hour period of time. Do not take more than 2 in a day and do not stop the diarrhea with medicine. The hyoscyamine is for abdominal cramps and pain. Take as directed. This can also slow down your diarrhea. If it does then do not take the Imodium. If he get worse, develop fevers, continued vomiting or having worse diarrhea or blood in your stool culture to the emergency department. Meds ordered this encounter  Medications  . ondansetron (ZOFRAN) 4 MG tablet    Sig: Take 1 tablet (4 mg total) by mouth every 6 (six) hours.    Dispense:  12 tablet    Refill:  0    Order Specific Question:   Supervising Provider    Answer:   Francesca OmanMURRAY, ALEXANDER [phone][[id number]]  . Hyoscyamine Sulfate SL (LEVSIN/SL) 0.125 MG SUBL    Sig: 1 tablet under the tongue every 6 hours as needed for abdominal pain and cramping.    Dispense:  15 each    Refill:  0    Order Specific Question:   Supervising Provider    Answer:   Francesca OmanMURRAY, ALEXANDER [[id number]]      Hayden RasmussenMabe, Lilac Hoff, NP 08/28/16 1426

## 2016-12-19 ENCOUNTER — Ambulatory Visit (INDEPENDENT_AMBULATORY_CARE_PROVIDER_SITE_OTHER): Payer: Medicaid Other | Admitting: Internal Medicine

## 2016-12-19 ENCOUNTER — Encounter: Payer: Self-pay | Admitting: Internal Medicine

## 2016-12-19 VITALS — BP 125/70 | HR 72 | Temp 97.9°F | Wt 179.0 lb

## 2016-12-19 DIAGNOSIS — N939 Abnormal uterine and vaginal bleeding, unspecified: Secondary | ICD-10-CM | POA: Insufficient documentation

## 2016-12-19 DIAGNOSIS — Z0000 Encounter for general adult medical examination without abnormal findings: Secondary | ICD-10-CM | POA: Diagnosis not present

## 2016-12-19 DIAGNOSIS — R319 Hematuria, unspecified: Secondary | ICD-10-CM | POA: Diagnosis not present

## 2016-12-19 DIAGNOSIS — D649 Anemia, unspecified: Secondary | ICD-10-CM

## 2016-12-19 DIAGNOSIS — N920 Excessive and frequent menstruation with regular cycle: Secondary | ICD-10-CM

## 2016-12-19 LAB — POCT UA - MICROSCOPIC ONLY

## 2016-12-19 LAB — POCT URINALYSIS DIP (MANUAL ENTRY)
Bilirubin, UA: NEGATIVE
Glucose, UA: NEGATIVE mg/dL
Ketones, POC UA: NEGATIVE mg/dL
Leukocytes, UA: NEGATIVE
Nitrite, UA: NEGATIVE
PROTEIN UA: NEGATIVE mg/dL
Spec Grav, UA: 1.025 (ref 1.010–1.025)
UROBILINOGEN UA: 0.2 U/dL
pH, UA: 5.5 (ref 5.0–8.0)

## 2016-12-19 NOTE — Patient Instructions (Addendum)
I want to do an ultrasound of your uterus just to make sure there are no abnormalities. I will follow up with you on those results.

## 2016-12-19 NOTE — Progress Notes (Signed)
Mallory Lin is a 46 y.o. female presents to office today for annual physical exam examination.  Concerns today include:None   Last eye exam: no issues with eyes  Last dental exam: yes Last pap smear: 2017, normal  Immunizations needed: does not want the flu vaccine  Refills needed today: none, does not need any medication   Women's Health  Periods:1-2 days of spotting every month.  Contraception: Tubal ligation  Pelvic symptoms: vaginal discharge, symptoms  Sexual activity:not sexual active, last time sexual active 2 months  STD Screening: none  Exercise: Running, walking  Diet: Eggs breakfast, lunch - pizza, Dinner [employer], sandwich- Sweet tea and [employer]  Smoking: none Alcohol: none  Drugs: none  Mood: no feelings of gulity, no depression, no SI     Past Medical History:  Diagnosis Date  . Recurrent boils   . UTI (lower urinary tract infection)    Social History   Social History  . Marital status: Single    Spouse name: N/A  . Number of children: N/A  . Years of education: N/A   Occupational History  . Not on file.   Social History Main Topics  . Smoking status: Never Smoker  . Smokeless tobacco: Never Used  . Alcohol use No  . Drug use: No  . Sexual activity: No   Other Topics Concern  . Not on file   Social History Narrative  . No narrative on file   Past Surgical History:  Procedure Laterality Date  . HYDRADENITIS EXCISION Bilateral 06/06/2012   Procedure: EXCISION HYDRADENITIS AXILLA;  Surgeon: Velora Heckler, MD;  Location: [facility];  Service: General;  Laterality: Bilateral;  . INCISE AND DRAIN ABCESS     for hidradenitis  . INCISION AND DRAINAGE ABSCESS ANAL    . teeth extracted    . TUBAL LIGATION     No family history on file.  ROS: Review of Systems Review of Systems  Constitutional: Negative for chills, fever and weight loss.  HENT: Negative for congestion and sore throat.   Eyes: Negative for blurred vision  and photophobia.  Respiratory: Negative for sputum production and shortness of breath.   Cardiovascular: Negative for chest pain and palpitations.  Gastrointestinal: Negative for nausea and vomiting.  Genitourinary: Negative for dysuria and urgency.  Musculoskeletal: Negative for back pain and neck pain.  Neurological: Negative for dizziness and headaches.  Psychiatric/Behavioral: Negative for depression, substance abuse and suicidal ideas. The patient is not nervous/anxious.     Physical exam Physical Exam  Constitutional: She is oriented to person, place, and time. She appears well-developed and well-nourished.  HENT:  Head: Normocephalic and atraumatic.  Right Ear: External ear normal.  Left Ear: External ear normal.  Nose: Nose normal.  Mouth/Throat: Oropharynx is clear and moist.  Eyes: Pupils are equal, round, and reactive to light.  Neck: Normal range of motion. Neck supple. No thyromegaly present.  Cardiovascular: Normal rate and regular rhythm.   Pulmonary/Chest: Effort normal and breath sounds normal.  Abdominal: Soft. Bowel sounds are normal.  Musculoskeletal: Normal range of motion.  Lymphadenopathy:    She has no cervical adenopathy.  Neurological: She is alert and oriented to person, place, and time.  Skin: Skin is warm. Capillary refill takes less than 2 seconds.  Psychiatric: She has a normal mood and affect.    Assessment/ Plan: Patient here for annual physical exam.   Annual physical exam Does not want the flu shot Lipid panel and BMET  Hematuria Hx of hematuria - last noted in 2014 Will obtain a UA    Vaginal spotting States for the past 5 years, has not really had a full menstrual period - states she just has spotting regular for about 2 days. Looks like she had some labs done back in 2014 which could be interpreted  - US PELVIS LIMITED (TRANSABDOMINAL ONLY); Future - US Transvaginal Non-OB; Future - CBC     Mallory Lin PGY-3, [facility]

## 2016-12-20 ENCOUNTER — Telehealth: Payer: Self-pay

## 2016-12-20 LAB — CBC
HEMATOCRIT: 37.3 % (ref 34.0–46.6)
Hemoglobin: 12.3 g/dL (ref 11.1–15.9)
MCH: 29.4 pg (ref 26.6–33.0)
MCHC: 33 g/dL (ref 31.5–35.7)
MCV: 89 fL (ref 79–97)
PLATELETS: 259 10*3/uL (ref 150–379)
RBC: 4.18 x10E6/uL (ref 3.77–5.28)
RDW: 14.7 % (ref 12.3–15.4)
WBC: 5.5 10*3/uL (ref 3.4–10.8)

## 2016-12-20 LAB — BASIC METABOLIC PANEL
BUN/Creatinine Ratio: 16 (ref 9–23)
BUN: 14 mg/dL (ref 6–24)
CALCIUM: 9.5 mg/dL (ref 8.7–10.2)
CHLORIDE: 102 mmol/L (ref 96–106)
CO2: 24 mmol/L (ref 20–29)
Creatinine, Ser: 0.88 mg/dL (ref 0.57–1.00)
GFR calc Af Amer: 91 mL/min/{1.73_m2} (ref >59)
GFR calc non Af Amer: 79 mL/min/{1.73_m2} (ref >59)
Glucose: 98 mg/dL (ref 65–99)
POTASSIUM: 4.3 mmol/L (ref 3.5–5.2)
Sodium: 140 mmol/L (ref 134–144)

## 2016-12-20 LAB — LIPID PANEL
CHOLESTEROL TOTAL: 182 mg/dL (ref 100–199)
Chol/HDL Ratio: 2.8 ratio (ref 0.0–4.4)
HDL: 65 mg/dL (ref >39)
LDL Calculated: 102 mg/dL — ABNORMAL HIGH (ref 0–99)
TRIGLYCERIDES: 73 mg/dL (ref 0–149)
VLDL Cholesterol Cal: 15 mg/dL (ref 5–40)

## 2016-12-20 NOTE — Telephone Encounter (Signed)
Called patient to inform her of her ultrasound appointment of Tuesday 12/25/2016 at [facility] at 1430 with a show time of 1415 with a full bladder (32 oz). There was no answer and the voice mail box is full.Glennie HawkSimpson, Puneet Masoner R

## 2016-12-21 NOTE — Telephone Encounter (Signed)
I am still not able to get in touch with patient and the voice mailbox remains full.  I have rescheduled the Ultra Sound for 01/04/2017 at [facility]. I will attempt to reach the patient by mail.Glennie HawkSimpson, Michelle R

## 2016-12-24 NOTE — Assessment & Plan Note (Signed)
States for the past 5 years, has not really had a full menstrual period - states she just has spotting regular for about 2 days. Looks like she had some labs done back in 2014 which could be interpreted  - US PELVIS LIMITED (TRANSABDOMINAL ONLY); Future - US Transvaginal Non-OB; Future - CBC

## 2016-12-24 NOTE — Assessment & Plan Note (Signed)
Hx of hematuria - last noted in 2014 Will obtain a UA

## 2016-12-24 NOTE — Assessment & Plan Note (Addendum)
Does not want the flu shot Lipid panel and BMET

## 2016-12-25 ENCOUNTER — Ambulatory Visit (HOSPITAL_COMMUNITY): Payer: Medicaid Other

## 2016-12-27 ENCOUNTER — Telehealth: Payer: Self-pay | Admitting: Internal Medicine

## 2016-12-27 DIAGNOSIS — R319 Hematuria, unspecified: Secondary | ICD-10-CM

## 2016-12-27 NOTE — Telephone Encounter (Signed)
Called patient regarding hematuria. Patient state she was seen previously by urology for this issue. Given patient has continued to have hematuria. I would like her to follow up again with urology   Thanks Sadae Arrazola

## 2016-12-27 NOTE — Addendum Note (Signed)
Addended by: Noralee CharsMIKELL, Yexalen Deike Z on: 12/27/2016 08:44 AM   Modules accepted: Orders

## 2017-01-04 ENCOUNTER — Ambulatory Visit (HOSPITAL_COMMUNITY): Payer: Medicaid Other

## 2017-01-23 ENCOUNTER — Ambulatory Visit (HOSPITAL_COMMUNITY)
Admission: RE | Admit: 2017-01-23 | Discharge: 2017-01-23 | Disposition: A | Discharge status: Home / Self Care | Payer: Medicaid Other | Source: Ambulatory Visit | Attending: Family Medicine | Admitting: Family Medicine

## 2017-01-23 ENCOUNTER — Other Ambulatory Visit: Payer: Self-pay | Admitting: Internal Medicine

## 2017-01-23 ENCOUNTER — Telehealth: Payer: Self-pay | Admitting: *Deleted

## 2017-01-23 DIAGNOSIS — N939 Abnormal uterine and vaginal bleeding, unspecified: Secondary | ICD-10-CM

## 2017-01-23 NOTE — Telephone Encounter (Signed)
[facility] called and needs orders changed from u/s pelvic limited to pelvic complete.  Dr. Cathlean Cower is postcall, so Dr. Gwendolyn Grant gave the verbal to change order . Fleeger, Maryjo Rochester, CMA

## 2017-01-28 NOTE — Progress Notes (Signed)
Is this post menopausal bleeding? If it is then let us go ahead with endometrial biopsy. If EB is normal and she continues with her symptoms, I will refer to gyn for further evaluation and management.

## 2017-01-31 ENCOUNTER — Telehealth: Payer: Self-pay | Admitting: *Deleted

## 2017-01-31 NOTE — Telephone Encounter (Signed)
Patient called and left message on [facility] Clinic voicemail - wants ultrasound results.  Will route message to PCP.  Altamese Dilling~Tyria Springer, BSN, RN-BC

## 2017-02-01 ENCOUNTER — Telehealth: Payer: Self-pay | Admitting: Internal Medicine

## 2017-02-01 NOTE — Telephone Encounter (Addendum)
Called patient to let her results were all within normal limits. We will need to bring her in to recheck her hormone levels and if those check out as her still just menstruating then no further issues.  Mallory Lin

## 2017-03-12 ENCOUNTER — Ambulatory Visit: Payer: Medicaid Other | Admitting: Family Medicine

## 2017-03-12 ENCOUNTER — Encounter: Payer: Self-pay | Admitting: Family Medicine

## 2017-03-12 ENCOUNTER — Other Ambulatory Visit: Payer: Self-pay

## 2017-03-12 ENCOUNTER — Other Ambulatory Visit (HOSPITAL_COMMUNITY)
Admission: RE | Admit: 2017-03-12 | Discharge: 2017-03-12 | Disposition: A | Discharge status: Home / Self Care | Payer: Medicaid Other | Source: Ambulatory Visit | Attending: Family Medicine | Admitting: Family Medicine

## 2017-03-12 VITALS — BP 122/78 | HR 58 | Temp 98.1°F | Wt 186.6 lb

## 2017-03-12 DIAGNOSIS — N898 Other specified noninflammatory disorders of vagina: Secondary | ICD-10-CM

## 2017-03-12 LAB — POCT WET PREP (WET MOUNT)
Clue Cells Wet Prep Whiff POC: POSITIVE
TRICHOMONAS WET PREP HPF POC: ABSENT

## 2017-03-12 MED ORDER — METRONIDAZOLE 500 MG PO TABS: 500.0000 mg | ORAL_TABLET | Freq: Two times a day (BID) | ORAL | 0 refills | Status: AC

## 2017-03-12 NOTE — Patient Instructions (Signed)
You were seen in clinic today for vaginal discharge and were tested for STDs.  I will call you with the results of your wet prep and other testing.  If at that time you need antibiotics since I will send them to your pharmacy.  If you have any new or worsening symptoms, you will need to return to be seen.  Be well,  Freddrick MarchYashika Tanae Petrosky, MD

## 2017-03-12 NOTE — Progress Notes (Signed)
   Subjective:   Patient ID: Mallory PintoLisa A Selleck    DOB: Sep 05, 1970, 46 y.o. female   MRN: [id number]  CC: STD check, vaginal discharge   HPI: Mallory Lin is a 46 y.o. female who presents to clinic today for vaginal discharge.  Vaginal discharge She reports slight and occasional vaginal discharge.  No itching or burning associated with it.  She states she has one sexual partner who does not have any symptoms currently.  She does not suspect she is pregnant currently.  No external irritation or bleeding.  No burning with urination.  She states a long time ago she had trichomonas which was treated and symptoms had resolved at the time.   ROS: No fevers, chills, nausea, vomiting.  No pelvic or abdominal pain. PMFSH: Pertinent past medical, surgical, family, and social history were reviewed and updated as appropriate. Smoking status reviewed. Medications reviewed.  Objective:   BP 122/78   Pulse (!) 58   Temp 98.1 F (36.7 C) (Oral)   Wt 186 lb 9.6 oz (84.6 kg)   LMP 07/04/2011   SpO2 99%   BMI 30.12 kg/m  Vitals and nursing note reviewed.  General: 46 year old female, NAD CV: RRR no MRG Lungs: Nonlabored Abdomen: Soft, nontender nondistended, positive bowel sounds Pelvic: Normal female external genitalia, small amount of whitish discharge present in vaginal vault, normal-appearing cervix, no CMT on bimanual exam Skin: warm, dry, no rash Extremities: warm and well perfused  Assessment & Plan:   Vaginal discharge Patient reports occasional vaginal discharge.  Not associated with burning or itching.  Denies urinary symptoms. -Safe sex counseling provided -Obtain wet prep and GC chlamydia  Orders Placed This Encounter  Procedures  . POCT Wet Prep [facility]([place]et [place])   Meds ordered this encounter  Medications  . metroNIDAZOLE (FLAGYL) 500 MG tablet    Sig: Take 1 tablet (500 mg total) by mouth 2 (two) times daily for 7 days.    Dispense:  14 tablet    Refill:  0   Follow-up:  PRN  Freddrick MarchYashika Ensley Blas, MD [facility], PGY-2 03/19/2017 8:10 PM

## 2017-03-13 LAB — CERVICOVAGINAL ANCILLARY ONLY
CHLAMYDIA, DNA PROBE: NEGATIVE
NEISSERIA GONORRHEA: NEGATIVE

## 2017-03-19 DIAGNOSIS — N898 Other specified noninflammatory disorders of vagina: Secondary | ICD-10-CM | POA: Insufficient documentation

## 2017-03-19 NOTE — Assessment & Plan Note (Addendum)
Patient reports occasional vaginal discharge.  Not associated with burning or itching.  Denies urinary symptoms. -Safe sex counseling provided -Obtain wet prep and GC chlamydia

## 2017-06-30 ENCOUNTER — Other Ambulatory Visit: Payer: Self-pay

## 2017-06-30 ENCOUNTER — Emergency Department (HOSPITAL_COMMUNITY)
Admission: EM | Admit: 2017-06-30 | Discharge: 2017-06-30 | Disposition: A | Discharge status: Home / Self Care | Payer: Medicaid Other | Attending: Emergency Medicine | Admitting: Emergency Medicine

## 2017-06-30 ENCOUNTER — Emergency Department (HOSPITAL_COMMUNITY): Payer: Medicaid Other

## 2017-06-30 ENCOUNTER — Encounter (HOSPITAL_COMMUNITY): Payer: Self-pay | Admitting: Emergency Medicine

## 2017-06-30 DIAGNOSIS — J039 Acute tonsillitis, unspecified: Secondary | ICD-10-CM

## 2017-06-30 DIAGNOSIS — R509 Fever, unspecified: Secondary | ICD-10-CM | POA: Diagnosis present

## 2017-06-30 DIAGNOSIS — J020 Streptococcal pharyngitis: Secondary | ICD-10-CM | POA: Diagnosis not present

## 2017-06-30 LAB — BASIC METABOLIC PANEL
Anion gap: 10 (ref 5–15)
BUN: 10 mg/dL (ref 6–20)
CALCIUM: 9.2 mg/dL (ref 8.9–10.3)
CHLORIDE: 99 mmol/L — AB (ref 101–111)
CO2: 24 mmol/L (ref 22–32)
CREATININE: 0.98 mg/dL (ref 0.44–1.00)
GFR calc Af Amer: >60 mL/min (ref >60)
GFR calc non Af Amer: >60 mL/min (ref >60)
Glucose, Bld: 97 mg/dL (ref 65–99)
Potassium: 3.8 mmol/L (ref 3.5–5.1)
SODIUM: 133 mmol/L — AB (ref 135–145)

## 2017-06-30 LAB — CBC WITH DIFFERENTIAL/PLATELET
BASOS PCT: 0 %
Basophils Absolute: 0 10*3/uL (ref 0.0–0.1)
EOS ABS: 0 10*3/uL (ref 0.0–0.7)
EOS PCT: 0 %
HCT: 43.9 % (ref 36.0–46.0)
Hemoglobin: 14.2 g/dL (ref 12.0–15.0)
LYMPHS ABS: 3.8 10*3/uL (ref 0.7–4.0)
Lymphocytes Relative: 22 %
MCH: 30 pg (ref 26.0–34.0)
MCHC: 32.3 g/dL (ref 30.0–36.0)
MCV: 92.8 fL (ref 78.0–100.0)
MONOS PCT: 6 %
Monocytes Absolute: 1.1 10*3/uL — ABNORMAL HIGH (ref 0.1–1.0)
Neutro Abs: 12.7 10*3/uL — ABNORMAL HIGH (ref 1.7–7.7)
Neutrophils Relative %: 72 %
PLATELETS: 278 10*3/uL (ref 150–400)
RBC: 4.73 MIL/uL (ref 3.87–5.11)
RDW: 13.9 % (ref 11.5–15.5)
WBC: 17.7 10*3/uL — ABNORMAL HIGH (ref 4.0–10.5)

## 2017-06-30 LAB — I-STAT CHEM 8, ED
BUN: 10 mg/dL (ref 6–20)
CALCIUM ION: 1.16 mmol/L (ref 1.15–1.40)
CHLORIDE: 101 mmol/L (ref 101–111)
Creatinine, Ser: 1 mg/dL (ref 0.44–1.00)
GLUCOSE: 96 mg/dL (ref 65–99)
HCT: 45 % (ref 36.0–46.0)
Hemoglobin: 15.3 g/dL — ABNORMAL HIGH (ref 12.0–15.0)
Potassium: 3.9 mmol/L (ref 3.5–5.1)
SODIUM: 139 mmol/L (ref 135–145)
TCO2: 27 mmol/L (ref 22–32)

## 2017-06-30 LAB — RAPID STREP SCREEN (MED CTR MEBANE ONLY): STREPTOCOCCUS, GROUP A SCREEN (DIRECT): POSITIVE — AB

## 2017-06-30 MED ORDER — DEXAMETHASONE 6 MG PO TABS: 6.0000 mg | ORAL_TABLET | Freq: Two times a day (BID) | ORAL | 0 refills | Status: DC

## 2017-06-30 MED ORDER — ACETAMINOPHEN 325 MG PO TABS: 650.0000 mg | ORAL_TABLET | Freq: Once | ORAL | Status: DC | PRN

## 2017-06-30 MED ORDER — KETOROLAC TROMETHAMINE 30 MG/ML IJ SOLN
30.0000 mg | Freq: Once | INTRAMUSCULAR | Status: AC
  Administered 2017-06-30: 30 mg via INTRAVENOUS
  Filled 2017-06-30: qty 1

## 2017-06-30 MED ORDER — IOPAMIDOL (ISOVUE-300) INJECTION 61%
INTRAVENOUS | Status: AC
  Administered 2017-06-30: 75 mL
  Filled 2017-06-30: qty 75

## 2017-06-30 MED ORDER — METHYLPREDNISOLONE SODIUM SUCC 125 MG IJ SOLR
125.0000 mg | Freq: Once | INTRAMUSCULAR | Status: AC
  Administered 2017-06-30: 125 mg via INTRAVENOUS
  Filled 2017-06-30: qty 2

## 2017-06-30 MED ORDER — CLINDAMYCIN PHOSPHATE 600 MG/50ML IV SOLN
600.0000 mg | Freq: Once | INTRAVENOUS | Status: AC
Start: 2017-06-30 — End: 2017-06-30
  Administered 2017-06-30: 600 mg via INTRAVENOUS
  Filled 2017-06-30: qty 50

## 2017-06-30 MED ORDER — CLINDAMYCIN HCL 300 MG PO CAPS: 300.0000 mg | ORAL_CAPSULE | Freq: Four times a day (QID) | ORAL | 0 refills | Status: DC

## 2017-06-30 MED ORDER — ACETAMINOPHEN 650 MG RE SUPP
650.0000 mg | Freq: Once | RECTAL | Status: AC
  Administered 2017-06-30: 650 mg via RECTAL
  Filled 2017-06-30: qty 1

## 2017-06-30 NOTE — ED Triage Notes (Addendum)
C/o fever, sore throat, and headache since Friday.  Pt states she is unable to swallow secretions.  States she is unable to swallow liquid Tylenol.

## 2017-06-30 NOTE — ED Provider Notes (Signed)
Patient placed in Quick Look pathway, seen and evaluated   Chief Complaint: Sore throat  HPI:   Patient had onset of sore throat 3 days ago.  She has progressively worsening pain and swelling and is unable to tolerate swallowing her own "secretions.  She complains of difficulty opening the jaw, voice change.  She is reluctant to speak and history is given by her partners who is here with her.  ROS: Sore throat, unable to swallow (one)  Physical Exam:   Gen: No distress  Neuro: Awake and Alert  Skin: Warm    Focused Exam: Exam is limited secondary to trismus.  Bilateral tonsillar hypertrophy and erythema.  Of note the palatine arch is swollen and nearly occludes the entire visual field of the pharynx.  I have ordered flu swab, CT soft tissues of the neck.   Initiation of care has begun. The patient has been counseled on the process, plan, and necessity for staying for the completion/evaluation, and the remainder of the medical screening examination    Arthor CaptainHarris, Voncille Simm, PA-C 06/30/17 1647    Raeford RazorKohut, Stephen, MD 06/30/17 [id number]

## 2017-06-30 NOTE — ED Provider Notes (Signed)
Emergency Department Provider Note   I have reviewed the triage vital signs and the nursing notes.   HISTORY  Chief Complaint Sore Throat; Fever; and Headache   HPI Mallory Lin is a 47 y.o. female without significant past medical history the presents the emergency department today with sore throat, fever and headache.  Patient states that for the last 2-3 days she progressively worsening sore throat but then today she had difficulty swallowing even liquids and has trouble swallowing her saliva.  She is breathing a bit harder than normal but no other respiratory issues.  No issues like this before.  Still has her tonsils.  No trauma.  Does not do anything for her symptoms at home. No other associated or modifying symptoms.    Past Medical History:  Diagnosis Date  . Recurrent boils   . UTI (lower urinary tract infection)     Patient Active Problem List   Diagnosis Date Noted  . Vaginal discharge 03/19/2017  . Vaginal spotting 12/19/2016  . Yeast vaginitis 12/16/2014  . Mild anemia 03/26/2013  . Hematuria 03/26/2013  . Dysuria 03/10/2013  . Hot flashes 03/10/2013  . Overweight (BMI 25.0-29.9) 01/15/2013  . Annual physical exam 01/15/2013  . Hidradenitis suppurativa 11/24/2010  . BOILS, RECURRENT 01/12/2008    Past Surgical History:  Procedure Laterality Date  . HYDRADENITIS EXCISION Bilateral 06/06/2012   Procedure: EXCISION HYDRADENITIS AXILLA;  Surgeon: Velora Heckler, MD;  Location: [facility];  Service: General;  Laterality: Bilateral;  . INCISE AND DRAIN ABCESS     for hidradenitis  . INCISION AND DRAINAGE ABSCESS ANAL    . teeth extracted    . TUBAL LIGATION      Current Outpatient Rx  . Order #: [id number] Class: Print  . Order #: [id number] Class: Print  . Order #: [id number] Class: Normal  . Order #: [id number] Class: Normal    Allergies Patient has no known allergies.  No family history on file.  Social History Social History    Tobacco Use  . Smoking status: Never Smoker  . Smokeless tobacco: Never Used  Substance Use Topics  . Alcohol use: No  . Drug use: No    Review of Systems  All other systems negative except as documented in the HPI. All pertinent positives and negatives as reviewed in the HPI. ____________________________________________   PHYSICAL EXAM:  VITAL SIGNS: ED Triage Vitals  Enc Vitals Group     BP 06/30/17 1625 (!) 147/88     Pulse Rate 06/30/17 1625 (!) 112     Resp 06/30/17 1625 18     Temp 06/30/17 1625 (!) 102 F (38.9 C)     Temp Source 06/30/17 1625 Oral     SpO2 06/30/17 1625 100 %     Weight 06/30/17 1627 173 lb (78.5 kg)     Height 06/30/17 1627 5\' 8"  (1.727 m)    Constitutional: Alert and oriented. Well appearing and in no acute distress. Eyes: Conjunctivae are normal. PERRL. EOMI. Head: Atraumatic. Nose: No congestion/rhinnorhea. Mouth/Throat: Mucous membranes are moist.  Oropharynx non-erythematous.  Significantly enlarged tonsils bilaterally.  Uvula is slightly to the right of midline and she has approximately half a centimeter of opening between her tonsils.  They are also erythematous as well.  Neck: No stridor.  No meningeal signs.   Cardiovascular: Normal rate, regular rhythm. Good peripheral circulation. Grossly normal heart sounds.   Respiratory: Normal respiratory effort.  No retractions. Lungs CTAB. Gastrointestinal: Soft and nontender. No distention.  Musculoskeletal: No lower extremity tenderness nor edema. No gross deformities of extremities. Neurologic:  Normal speech and language. No gross focal neurologic deficits are appreciated.  Skin:  Skin is warm, dry and intact. No rash noted.  ____________________________________________   LABS (all labs ordered are listed, but only abnormal results are displayed)  Labs Reviewed  RAPID STREP SCREEN (NOT AT [facility]) - Abnormal; Notable for the following components:      Result Value   Streptococcus,  Group A Screen (Direct) POSITIVE (*)    All other components within normal limits  BASIC METABOLIC PANEL - Abnormal; Notable for the following components:   Sodium 133 (*)    Chloride 99 (*)    All other components within normal limits  CBC WITH DIFFERENTIAL/PLATELET - Abnormal; Notable for the following components:   WBC 17.7 (*)    Neutro Abs 12.7 (*)    Monocytes Absolute 1.1 (*)    All other components within normal limits  I-STAT CHEM 8, ED - Abnormal; Notable for the following components:   Hemoglobin 15.3 (*)    All other components within normal limits   ____________________________________________   RADIOLOGY  Ct Soft Tissue Neck W Contrast  Result Date: 06/30/2017 CLINICAL DATA:  47 year old female with fever sore throat and headache for 2 days. Difficulty swallowing. EXAM: CT NECK WITH CONTRAST TECHNIQUE: Multidetector CT imaging of the neck was performed using the standard protocol following the bolus administration of intravenous contrast. CONTRAST:  75mL ISOVUE-300 IOPAMIDOL (ISOVUE-300) INJECTION 61% COMPARISON:  Head CT without contrast and cervical spine radiographs 10/01/2014. FINDINGS: Pharynx and larynx: Motion artifact at the larynx and hypopharynx. Subsequent blurring of the epiglottis. The other laryngeal contours appear within normal limits. Mild lingual tonsil hyperenhancement. Moderate palatine tonsil enlargement bilaterally with opposed tonsils in the midline. Expected tonsillar enhancement on the right. Questionable subtle hypodensity within the left palatine tonsil (series 3, image 24), however, the bilateral parapharyngeal spaces appear to remain normal arguing against a peritonsillar abscess. Trace retropharyngeal effusion. Mild adenoid hyperenhancement. Salivary glands: Sublingual space, submandibular glands, and parotid glands are within normal limits. Thyroid: Negative. Lymph nodes: Hyperenhancing bilateral level 2 lymph nodes, perhaps more so on the left. The  affected nodes are mildly enlarged up to 13-14 millimeter short axis. The level 3 nodes may be mildly increased in number bilaterally. Level 1 nodes are within normal limits. The level 4 and level 5 nodes appear normal. No cystic or necrotic nodes. Vascular: The major vascular structures in the neck and at the skull base are patent, including the bilateral internal jugular veins. Limited intracranial: Negative. Visualized orbits: Negative. Mastoids and visualized paranasal sinuses: Mild bubbly opacity in the left sphenoid sinus. The maxillary sinuses are clear. Visible tympanic cavities and mastoids are clear. Skeleton: Previously extracted posterior mandible dentition. No acute dental findings. No acute osseous abnormality identified. Upper chest: Normal lung apices.  Normal superior mediastinum. IMPRESSION: 1. Acute Tonsillitis suspected, but there is no convincing tonsillar abscess. Recommend a repeat Neck CT with IV contrast if the patient does not improve with treatment as expected. 2. Reactive lymphadenopathy with no suppurative lymph nodes. Reactive mild retropharyngeal space effusion. Electronically Signed   By: Odessa Fleming M.D.   On: 06/30/2017 18:38    ____________________________________________   PROCEDURES  Procedure(s) performed:   Procedures   ____________________________________________   INITIAL IMPRESSION / ASSESSMENT AND PLAN / ED COURSE  Of antibiotics, steroids and fluids patient's trismus is improved significantly as has her posterior oropharyngeal opening.  I can  actually see the uvula moving back and forth and the opening is at least 3 times wider than it was earlier.  She is tolerating p.o. fluids and solids at this time.  No respiratory distress.  I feel like at this point she is stable for discharge on antibiotics and repeat doses of steroids at home she will return here for any new or worsening symptoms otherwise will continue antibiotics and steroids at home.   Pertinent  labs & imaging results that were available during my care of the patient were reviewed by me and considered in my medical decision making (see chart for details).  ____________________________________________  FINAL CLINICAL IMPRESSION(S) / ED DIAGNOSES  Final diagnoses:  Tonsillitis  Strep throat     MEDICATIONS GIVEN DURING THIS VISIT:  Medications  acetaminophen (TYLENOL) suppository 650 mg (650 mg Rectal Given 06/30/17 1642)  iopamidol (ISOVUE-300) 61 % injection (75 mLs  Contrast Given 06/30/17 1809)  clindamycin (CLEOCIN) IVPB 600 mg (0 mg Intravenous Stopped 06/30/17 1951)  methylPREDNISolone sodium succinate (SOLU-MEDROL) 125 mg/2 mL injection 125 mg (125 mg Intravenous Given 06/30/17 1858)  ketorolac (TORADOL) 30 MG/ML injection 30 mg (30 mg Intravenous Given 06/30/17 1856)     NEW OUTPATIENT MEDICATIONS STARTED DURING THIS VISIT:  Discharge Medication List as of 06/30/2017  9:39 PM    START taking these medications   Details  clindamycin (CLEOCIN) 300 MG capsule Take 1 capsule (300 mg total) by mouth 4 (four) times daily. X 7 days, Starting Sun 06/30/2017, Print    dexamethasone (DECADRON) 6 MG tablet Take 1 tablet (6 mg total) by mouth 2 (two) times daily with a meal., Starting Sun 06/30/2017, Print        Note:  This note was prepared with assistance of Dragon voice recognition software. Occasional wrong-word or sound-a-like substitutions may have occurred due to the inherent limitations of voice recognition software.   Marily MemosMesner, Lyann Hagstrom, MD 06/30/17 2255

## 2017-06-30 NOTE — ED Notes (Signed)
Patient was able to tolerate PO liquids and crackers. MD aware.

## 2017-07-22 ENCOUNTER — Encounter (HOSPITAL_COMMUNITY): Payer: Self-pay | Admitting: Emergency Medicine

## 2017-07-22 ENCOUNTER — Ambulatory Visit (HOSPITAL_COMMUNITY)
Admission: EM | Admit: 2017-07-22 | Discharge: 2017-07-22 | Disposition: A | Discharge status: Home / Self Care | Payer: Medicaid Other | Attending: Family Medicine | Admitting: Family Medicine

## 2017-07-22 DIAGNOSIS — M545 Low back pain, unspecified: Secondary | ICD-10-CM

## 2017-07-22 LAB — POCT URINALYSIS DIP (DEVICE)
Bilirubin Urine: NEGATIVE
Glucose, UA: NEGATIVE mg/dL
Ketones, ur: NEGATIVE mg/dL
Leukocytes, UA: NEGATIVE
NITRITE: NEGATIVE
PH: 5.5 (ref 5.0–8.0)
PROTEIN: NEGATIVE mg/dL
SPECIFIC GRAVITY, URINE: 1.025 (ref 1.005–1.030)
Urobilinogen, UA: 0.2 mg/dL (ref 0.0–1.0)

## 2017-07-22 MED ORDER — KETOROLAC TROMETHAMINE 60 MG/2ML IM SOLN
INTRAMUSCULAR | Status: AC
  Filled 2017-07-22: qty 2

## 2017-07-22 MED ORDER — NAPROXEN 500 MG PO TABS: 500.0000 mg | ORAL_TABLET | Freq: Two times a day (BID) | ORAL | 0 refills | Status: DC

## 2017-07-22 MED ORDER — KETOROLAC TROMETHAMINE 60 MG/2ML IM SOLN
60.0000 mg | Freq: Once | INTRAMUSCULAR | Status: AC
  Administered 2017-07-22: 60 mg via INTRAMUSCULAR

## 2017-07-22 NOTE — ED Provider Notes (Signed)
[facility]    CSN: [id number] Arrival date & time: 07/22/17  [id number]     History   Chief Complaint Chief Complaint  Patient presents with  . Back Pain    HPI Unk Mallory Lin is a 47 y.o. female.   Mallory Lin presents with complaints of left low back pain which started 3 days ago. No specific known injury. States she was playing at a park with her children and was running etc. No known heavy lifting or specific injury, however. Pain is worse with increased walking or with laying flat, has been worse at night. Improves with laying on her side. States she feels she may have had similar pain in the past, years ago. Pain to left buttock as well but does not radiate down thigh or to foot. Denies abdominal pain, fevers, nausea, vomiting, urinary symptoms. Has not taken any medications for pain. Hx of uti's, boils, hs.    ROS per HPI.      Past Medical History:  Diagnosis Date  . Recurrent boils   . UTI (lower urinary tract infection)     Patient Active Problem List   Diagnosis Date Noted  . Vaginal discharge 03/19/2017  . Vaginal spotting 12/19/2016  . Yeast vaginitis 12/16/2014  . Mild anemia 03/26/2013  . Hematuria 03/26/2013  . Dysuria 03/10/2013  . Hot flashes 03/10/2013  . Overweight (BMI 25.0-29.9) 01/15/2013  . Annual physical exam 01/15/2013  . Hidradenitis suppurativa 11/24/2010  . BOILS, RECURRENT 01/12/2008    Past Surgical History:  Procedure Laterality Date  . HYDRADENITIS EXCISION Bilateral 06/06/2012   Procedure: EXCISION HYDRADENITIS AXILLA;  Surgeon: Velora Hecklerodd M Gerkin, MD;  Location: [facility];  Service: General;  Laterality: Bilateral;  . INCISE AND DRAIN ABCESS     for hidradenitis  . INCISION AND DRAINAGE ABSCESS ANAL    . teeth extracted    . TUBAL LIGATION      OB History   None      Home Medications    Prior to Admission medications   Medication Sig Start Date End Date Taking? Authorizing Provider  clindamycin  (CLEOCIN) 300 MG capsule Take 1 capsule (300 mg total) by mouth 4 (four) times daily. X 7 days 06/30/17   Mesner, Barbara CowerJason, MD  dexamethasone (DECADRON) 6 MG tablet Take 1 tablet (6 mg total) by mouth 2 (two) times daily with a meal. 06/30/17   Mesner, Barbara CowerJason, MD  Hyoscyamine Sulfate SL (LEVSIN/SL) 0.125 MG SUBL 1 tablet under the tongue every 6 hours as needed for abdominal pain and cramping. 08/28/16   Hayden RasmussenMabe, David, NP  naproxen (NAPROSYN) 500 MG tablet Take 1 tablet (500 mg total) by mouth 2 (two) times daily. 07/22/17   Georgetta HaberBurky, Laria Grimmett B, NP  ondansetron (ZOFRAN) 4 MG tablet Take 1 tablet (4 mg total) by mouth every 6 (six) hours. 08/28/16   Hayden RasmussenMabe, David, NP    Family History History reviewed. No pertinent family history.  Social History Social History   Tobacco Use  . Smoking status: Never Smoker  . Smokeless tobacco: Never Used  Substance Use Topics  . Alcohol use: No  . Drug use: No     Allergies   Patient has no known allergies.   Review of Systems Review of Systems   Physical Exam Triage Vital Signs ED Triage Vitals [07/22/17 1012]  Enc Vitals Group     BP 119/81     Pulse Rate 72     Resp 16     Temp  98.2 F (36.8 C)     Temp Source Oral     SpO2 100 %     Weight      Height      Head Circumference      Peak Flow      Pain Score      Pain Loc      Pain Edu?      Excl. in GC?    No data found.  Updated Vital Signs BP 119/81 (BP Location: Left Arm)   Pulse 72   Temp 98.2 F (36.8 C) (Oral)   Resp 16   LMP 07/04/2011   SpO2 100%   Visual Acuity Right Eye Distance:   Left Eye Distance:   Bilateral Distance:    Right Eye Near:   Left Eye Near:    Bilateral Near:     Physical Exam  Constitutional: She is oriented to person, place, and time. She appears well-developed and well-nourished. No distress.  Cardiovascular: Normal rate, regular rhythm and normal heart sounds.  Pulmonary/Chest: Effort normal and breath sounds normal.  Musculoskeletal:        Lumbar back: She exhibits pain. She exhibits normal range of motion, no tenderness, no bony tenderness, no swelling, no edema, no deformity, no laceration, no spasm and normal pulse.       Back:  Without tenderness on exam; without pain with heel or toe touch weight bearing; ambulatory without difficulty; without pain with hip flexion or straight leg raise; sensation intact; strength equal to bilateral lower extremities   Neurological: She is alert and oriented to person, place, and time.  Skin: Skin is warm and dry.     UC Treatments / Results  Labs (all labs ordered are listed, but only abnormal results are displayed) Labs Reviewed  POCT URINALYSIS DIP (DEVICE) - Abnormal; Notable for the following components:      Result Value   Hgb urine dipstick MODERATE (*)    All other components within normal limits    EKG None Radiology No results found.  Procedures Procedures (including critical care time)  Medications Ordered in UC Medications  ketorolac (TORADOL) injection 60 mg (60 mg Intramuscular Given 07/22/17 1026)     Initial Impression / Assessment and Plan / UC Course  I have reviewed the triage vital signs and the nursing notes.  Pertinent labs & imaging results that were available during my care of the patient were reviewed by me and considered in my medical decision making (see chart for details).     Without red flag findings. No specific known injury but activity and engagement of back does sound to increase pain, likely muscular in nature. toradol provided in clinic today. Naproxen twice a day for the next week. Light and regular activity as tolerated. Patient verbalized understanding and agreeable to plan.  Ambulatory out of clinic without difficulty.    Final Clinical Impressions(s) / UC Diagnoses   Final diagnoses:  Acute left-sided low back pain without sciatica    ED Discharge Orders        Ordered    naproxen (NAPROSYN) 500 MG tablet  2 times daily      07/22/17 1040       Controlled Substance Prescriptions [place] Controlled Substance Registry consulted? Not Applicable   Georgetta Haber, NP 07/22/17 1043

## 2017-07-22 NOTE — ED Triage Notes (Signed)
Pt sts left lower back pain x 5 days

## 2017-07-22 NOTE — Discharge Instructions (Addendum)
Your urine does not indicate urinary tract infection at this time.  Light and regular activity. Heat application during the day may be helpful, ice a night. Sleep with pillows under the knees. Naproxen, twice a day, take with food. First dose tonight. Please continue to follow up with your primary care provider for recheck of symptoms in the next 2-3 weeks. Return or see your primary care provider sooner if worsening or new symptoms develop.

## 2018-04-15 ENCOUNTER — Encounter (HOSPITAL_COMMUNITY): Payer: Self-pay

## 2018-04-15 ENCOUNTER — Ambulatory Visit (HOSPITAL_COMMUNITY)
Admission: EM | Admit: 2018-04-15 | Discharge: 2018-04-15 | Disposition: A | Discharge status: Home / Self Care | Payer: Medicaid Other | Attending: Family Medicine | Admitting: Family Medicine

## 2018-04-15 DIAGNOSIS — M5432 Sciatica, left side: Secondary | ICD-10-CM | POA: Diagnosis not present

## 2018-04-15 MED ORDER — CYCLOBENZAPRINE HCL 10 MG PO TABS: 10.0000 mg | ORAL_TABLET | Freq: Two times a day (BID) | ORAL | 0 refills | Status: DC | PRN

## 2018-04-15 MED ORDER — NAPROXEN 500 MG PO TABS: 500.0000 mg | ORAL_TABLET | Freq: Two times a day (BID) | ORAL | 0 refills | Status: DC

## 2018-04-15 NOTE — Discharge Instructions (Signed)
Do not drive while taking the muscle relaxer as it will make you sleepy. Follow up with your doctor or return here as needed.  °

## 2018-04-15 NOTE — ED Triage Notes (Signed)
Pt presents with pain on left hip side that radiates around to her back.

## 2018-04-15 NOTE — ED Provider Notes (Signed)
[facility]    CSN: [id number] Arrival date & time: 04/15/18  0802     History   Chief Complaint Chief Complaint  Patient presents with  . Back Pain    Left    HPI Mallory Lin is a 47 y.o. female who presents to the UC with c/o left side and back pain. The symptoms started 2 days and have gotten worse. Patient reports taking tylenol without relief. Patient denies UTI symptoms. BTL for birth control.   The history is provided by the patient. No language interpreter was used.  Back Pain  Location:  Lumbar spine Quality:  Aching Radiates to: left buttock. Pain severity:  Moderate Duration:  2 days Timing:  Constant Progression:  Worsening Chronicity:  New Relieved by:  Nothing Worsened by:  Bending and twisting Ineffective treatments: tylenol. Associated symptoms: no abdominal pain, no bladder incontinence, no bowel incontinence, no chest pain, no dysuria, no fever, no headaches and no weakness     Past Medical History:  Diagnosis Date  . Recurrent boils   . UTI (lower urinary tract infection)     Patient Active Problem List   Diagnosis Date Noted  . Vaginal discharge 03/19/2017  . Vaginal spotting 12/19/2016  . Yeast vaginitis 12/16/2014  . Mild anemia 03/26/2013  . Hematuria 03/26/2013  . Dysuria 03/10/2013  . Hot flashes 03/10/2013  . Overweight (BMI 25.0-29.9) 01/15/2013  . Annual physical exam 01/15/2013  . Hidradenitis suppurativa 11/24/2010  . BOILS, RECURRENT 01/12/2008    Past Surgical History:  Procedure Laterality Date  . HYDRADENITIS EXCISION Bilateral 06/06/2012   Procedure: EXCISION HYDRADENITIS AXILLA;  Surgeon: Velora Hecklerodd M Gerkin, MD;  Location: [facility];  Service: General;  Laterality: Bilateral;  . INCISE AND DRAIN ABCESS     for hidradenitis  . INCISION AND DRAINAGE ABSCESS ANAL    . teeth extracted    . TUBAL LIGATION      OB History   No obstetric history on file.      Home Medications    Prior  to Admission medications   Medication Sig Start Date End Date Taking? Authorizing Provider  cyclobenzaprine (FLEXERIL) 10 MG tablet Take 1 tablet (10 mg total) by mouth 2 (two) times daily as needed for muscle spasms. 04/15/18   Janne NapoleonNeese, Hope M, NP  Hyoscyamine Sulfate SL (LEVSIN/SL) 0.125 MG SUBL 1 tablet under the tongue every 6 hours as needed for abdominal pain and cramping. 08/28/16   Hayden RasmussenMabe, David, NP  naproxen (NAPROSYN) 500 MG tablet Take 1 tablet (500 mg total) by mouth 2 (two) times daily. 04/15/18   Janne NapoleonNeese, Hope M, NP  ondansetron (ZOFRAN) 4 MG tablet Take 1 tablet (4 mg total) by mouth every 6 (six) hours. 08/28/16   Hayden RasmussenMabe, David, NP    Family History No family history on file.  Social History Social History   Tobacco Use  . Smoking status: Never Smoker  . Smokeless tobacco: Never Used  Substance Use Topics  . Alcohol use: No  . Drug use: No     Allergies   Patient has no known allergies.   Review of Systems Review of Systems  Constitutional: Negative for chills and fever.  HENT: Negative.   Eyes: Negative for discharge, redness and visual disturbance.  Respiratory: Negative for cough and shortness of breath.   Cardiovascular: Negative for chest pain and leg swelling.  Gastrointestinal: Negative for abdominal pain, bowel incontinence, diarrhea, nausea and vomiting.  Genitourinary: Negative for bladder incontinence, dysuria, frequency,  urgency, vaginal bleeding and vaginal discharge.  Musculoskeletal: Positive for back pain.  Skin: Negative for rash.  Neurological: Negative for weakness, light-headedness and headaches.  Hematological: Negative for adenopathy.  Psychiatric/Behavioral: Negative for confusion.     Physical Exam Triage Vital Signs ED Triage Vitals  Enc Vitals Group     BP      Pulse      Resp      Temp      Temp src      SpO2      Weight      Height      Head Circumference      Peak Flow      Pain Score      Pain Loc      Pain Edu?       Excl. in GC?    No data found.  Updated Vital Signs BP (!) 149/87 (BP Location: Left Arm)   Pulse 64   Temp 98.2 F (36.8 C) (Oral)   Resp 20   LMP 07/04/2011   SpO2 100%   Visual Acuity Right Eye Distance:   Left Eye Distance:   Bilateral Distance:    Right Eye Near:   Left Eye Near:    Bilateral Near:     Physical Exam Vitals signs and nursing note reviewed.  Constitutional:      General: She is not in acute distress.    Appearance: She is well-developed.  HENT:     Head: Normocephalic and atraumatic.     Right Ear: Tympanic membrane normal.     Left Ear: Tympanic membrane normal.     Nose: Nose normal.     Mouth/Throat:     Mouth: Mucous membranes are moist.  Eyes:     Extraocular Movements: Extraocular movements intact.     Conjunctiva/sclera: Conjunctivae normal.  Neck:     Musculoskeletal: Neck supple.  Cardiovascular:     Rate and Rhythm: Normal rate.  Pulmonary:     Effort: Pulmonary effort is normal.  Abdominal:     General: Bowel sounds are normal.     Palpations: Abdomen is soft.     Tenderness: There is no abdominal tenderness.  Musculoskeletal:     Lumbar back: She exhibits tenderness (left ) and spasm. She exhibits no deformity, no laceration and normal pulse. Decreased range of motion: due to pain.  Skin:    General: Skin is warm and dry.  Neurological:     Mental Status: She is alert and oriented to person, place, and time.     Motor: Motor function is intact.     Gait: Gait normal.     Deep Tendon Reflexes: Reflexes are normal and symmetric.  Psychiatric:        Mood and Affect: Mood normal.      UC Treatments / Results  Labs (all labs ordered are listed, but only abnormal results are displayed) Labs Reviewed - No data to display Radiology No results found.  Procedures Procedures (including critical care time)  Medications Ordered in UC Medications - No data to display  Initial Impression / Assessment and Plan / UC Course  I  have reviewed the triage vital signs and the nursing notes. Patient with back pain.  No neurological deficits and normal neuro exam.  Patient can walk but states is painful.  No loss of bowel or bladder control.  No concern for cauda equina.  No fever, night sweats, weight loss, h/o cancer, IVDU.  RICE protocol and  pain medicine indicated and discussed with patient.   Final Clinical Impressions(s) / UC Diagnoses   Final diagnoses:  Sciatica, left side     Discharge Instructions     Do not drive while taking the muscle relaxer as it will make you sleepy. Follow up with your doctor or return here as needed.     ED Prescriptions    Medication Sig Dispense Auth. Provider   naproxen (NAPROSYN) 500 MG tablet Take 1 tablet (500 mg total) by mouth 2 (two) times daily. 20 tablet Kerrie Buffalo M, NP   cyclobenzaprine (FLEXERIL) 10 MG tablet Take 1 tablet (10 mg total) by mouth 2 (two) times daily as needed for muscle spasms. 20 tablet Janne Napoleon, NP     Controlled Substance Prescriptions N/A   Janne Napoleon, [place] 04/15/18 [phone]

## 2018-09-01 DIAGNOSIS — Z79899 Other long term (current) drug therapy: Secondary | ICD-10-CM | POA: Diagnosis not present

## 2018-09-03 DIAGNOSIS — Z79899 Other long term (current) drug therapy: Secondary | ICD-10-CM | POA: Diagnosis not present

## 2018-09-09 DIAGNOSIS — Z79899 Other long term (current) drug therapy: Secondary | ICD-10-CM | POA: Diagnosis not present

## 2018-09-11 DIAGNOSIS — Z79899 Other long term (current) drug therapy: Secondary | ICD-10-CM | POA: Diagnosis not present

## 2018-09-16 DIAGNOSIS — Z79899 Other long term (current) drug therapy: Secondary | ICD-10-CM | POA: Diagnosis not present

## 2018-09-18 DIAGNOSIS — Z79899 Other long term (current) drug therapy: Secondary | ICD-10-CM | POA: Diagnosis not present

## 2018-09-23 DIAGNOSIS — Z79899 Other long term (current) drug therapy: Secondary | ICD-10-CM | POA: Diagnosis not present

## 2018-09-25 DIAGNOSIS — Z79899 Other long term (current) drug therapy: Secondary | ICD-10-CM | POA: Diagnosis not present

## 2018-09-30 DIAGNOSIS — Z79899 Other long term (current) drug therapy: Secondary | ICD-10-CM | POA: Diagnosis not present

## 2018-10-02 DIAGNOSIS — Z79899 Other long term (current) drug therapy: Secondary | ICD-10-CM | POA: Diagnosis not present

## 2018-10-07 DIAGNOSIS — Z79899 Other long term (current) drug therapy: Secondary | ICD-10-CM | POA: Diagnosis not present

## 2018-10-09 DIAGNOSIS — Z79899 Other long term (current) drug therapy: Secondary | ICD-10-CM | POA: Diagnosis not present

## 2018-10-15 DIAGNOSIS — Z79899 Other long term (current) drug therapy: Secondary | ICD-10-CM | POA: Diagnosis not present

## 2018-10-17 DIAGNOSIS — Z79899 Other long term (current) drug therapy: Secondary | ICD-10-CM | POA: Diagnosis not present

## 2018-10-20 ENCOUNTER — Telehealth: Payer: Self-pay | Admitting: *Deleted

## 2018-10-20 NOTE — Telephone Encounter (Signed)
Pt states that she has a yeast infection and is requesting diflucan to be called in.  Advised that she would need an appt for the pill.  She is reluctant to an appt, so she will try OTC monistat first.   She will call back for appt.  Christen Bame, CMA

## 2018-10-21 ENCOUNTER — Other Ambulatory Visit: Payer: Self-pay

## 2018-10-21 ENCOUNTER — Encounter: Payer: Self-pay | Admitting: Family Medicine

## 2018-10-21 ENCOUNTER — Ambulatory Visit (INDEPENDENT_AMBULATORY_CARE_PROVIDER_SITE_OTHER): Payer: Medicaid Other | Admitting: Family Medicine

## 2018-10-21 VITALS — BP 115/75 | HR 62 | Temp 98.2°F | Wt 185.0 lb

## 2018-10-21 DIAGNOSIS — Z79899 Other long term (current) drug therapy: Secondary | ICD-10-CM | POA: Diagnosis not present

## 2018-10-21 DIAGNOSIS — A599 Trichomoniasis, unspecified: Secondary | ICD-10-CM | POA: Diagnosis not present

## 2018-10-21 DIAGNOSIS — N898 Other specified noninflammatory disorders of vagina: Secondary | ICD-10-CM | POA: Diagnosis not present

## 2018-10-21 LAB — POCT WET PREP (WET MOUNT)
Clue Cells Wet Prep Whiff POC: POSITIVE
WBC, Wet Prep HPF POC: >20

## 2018-10-21 MED ORDER — METRONIDAZOLE 500 MG PO TABS
2000.0000 mg | ORAL_TABLET | Freq: Once | ORAL | Status: AC
  Administered 2018-10-21: 10:00:00 2000 mg via ORAL

## 2018-10-21 NOTE — Progress Notes (Addendum)
     Subjective:    Patient ID: Mallory Lin, female    DOB: May 09, 1970, 48 y.o.   MRN: [id number]   CC: Vaginal discharge  HPI: Mallory Lin is a 48 y.o. female presents to clinic with complaints of vaginal discharge x3days. She is experiencing constant burning and itching. She is using a vaginal cream with minimal improvement. Today she declines GC/Chlyamidia and HIV testing.  Pertinent ROS: Pertinent for mild dysuria, burning and itching. Denies pelvic pain or vaginal bleeding.  Smoking status reviewed  Review of Systems Per HPI, also denies recent illness, fever, headache, changes in vision, chest pain, shortness of breath, abdominal pain, N/V/D, weakness   Patient Active Problem List   Diagnosis Date Noted  . Trichimoniasis 10/21/2018  . Vaginal discharge 03/19/2017  . Vaginal spotting 12/19/2016  . Yeast vaginitis 12/16/2014  . Mild anemia 03/26/2013  . Hematuria 03/26/2013  . Dysuria 03/10/2013  . Hot flashes 03/10/2013  . Overweight (BMI 25.0-29.9) 01/15/2013  . Annual physical exam 01/15/2013  . Hidradenitis suppurativa 11/24/2010  . BOILS, RECURRENT 01/12/2008     Objective:  BP 115/75   Pulse 62   Temp 98.2 F (36.8 C) (Oral)   Wt 185 lb (83.9 kg)   LMP 07/04/2011   SpO2 98%   BMI 28.13 kg/m  Vitals and nursing note reviewed  General: NAD, pleasant Cardiac: RRR, normal heart sounds, no murmurs Respiratory: CTAB, normal effort Abdomen: soft, nontender, nondistended Pelvic exam: normal external genitalia, vulva, vagina, cervix, uterus and adnexa, yellow-green discharge noted on exam. (Chaperone attended). Extremities: no edema or cyanosis. WWP. Skin: warm and dry, no rashes noted Neuro: alert and oriented, no focal deficits Psych: normal affect  Assessment & Plan:    Trichimoniasis 1. Take 2g Flagyl PO x1 with food and water. Return for additional STI testing as soon as possible. Contact your partner as they will need to be treated due to  the risk of reinfection is very high. Return if symptoms do not improve or sooner if needed 2. POCT wet prep ordered.   Gerlene Fee, DO Family Medicine Resident PGY-1

## 2018-10-21 NOTE — Patient Instructions (Signed)
It was nice to meet you today. Please eat following medication received today. Limit drinking alcohol with the use of metronidazole. Reschedule for additional testing at your earliest convenience.

## 2018-10-21 NOTE — Assessment & Plan Note (Addendum)
1. Take 2g Flagyl PO x1 with food and water. Return for additional STI testing as soon as possible. Contact your partner as they will need to be treated due to the risk of reinfection is very high. Return if symptoms do not improve or sooner if needed

## 2018-10-23 DIAGNOSIS — Z79899 Other long term (current) drug therapy: Secondary | ICD-10-CM | POA: Diagnosis not present

## 2018-10-28 DIAGNOSIS — Z79899 Other long term (current) drug therapy: Secondary | ICD-10-CM | POA: Diagnosis not present

## 2018-10-30 DIAGNOSIS — Z79899 Other long term (current) drug therapy: Secondary | ICD-10-CM | POA: Diagnosis not present

## 2018-11-03 ENCOUNTER — Ambulatory Visit (INDEPENDENT_AMBULATORY_CARE_PROVIDER_SITE_OTHER): Payer: Medicaid Other | Admitting: Family Medicine

## 2018-11-03 ENCOUNTER — Other Ambulatory Visit: Payer: Self-pay

## 2018-11-03 ENCOUNTER — Other Ambulatory Visit (HOSPITAL_COMMUNITY)
Admission: RE | Admit: 2018-11-03 | Discharge: 2018-11-03 | Disposition: A | Discharge status: Home / Self Care | Payer: Medicaid Other | Source: Ambulatory Visit | Attending: Family Medicine | Admitting: Family Medicine

## 2018-11-03 VITALS — BP 124/82 | HR 65

## 2018-11-03 DIAGNOSIS — N898 Other specified noninflammatory disorders of vagina: Secondary | ICD-10-CM

## 2018-11-03 DIAGNOSIS — R102 Pelvic and perineal pain: Secondary | ICD-10-CM | POA: Insufficient documentation

## 2018-11-03 DIAGNOSIS — Z124 Encounter for screening for malignant neoplasm of cervix: Secondary | ICD-10-CM

## 2018-11-03 DIAGNOSIS — Z113 Encounter for screening for infections with a predominantly sexual mode of transmission: Secondary | ICD-10-CM

## 2018-11-03 LAB — POCT WET PREP (WET MOUNT)
Clue Cells Wet Prep Whiff POC: NEGATIVE
Trichomonas Wet Prep HPF POC: ABSENT

## 2018-11-03 NOTE — Patient Instructions (Signed)
Your wet prep did not show any trichomonas on it, which means the vaginal itching and burning is likely due to gonorrhea or chlamydia or another STI.  The results of the gonorrhea/chlamydia test would not result today.  When I get the results, I will prescribed a medication for you based on which ever one is positive.  If you plan to have sexual intercourse with anybody, you will need to wait 7 days after your treatment.  I would advise that after we know the results of your test you contact the last person he had sexual intercourse with 2 of them now that they should be tested as well.  Have a great day,  Clemetine Marker, MD

## 2018-11-03 NOTE — Progress Notes (Signed)
   [facility] Phone: [phone]   cc: vaginal pain/pruritus  Subjective:  Vaginal pain/pruritus: Patient states that since she was last here earlier this month she has not had any relief in symptoms.  She has not had sexual intercourse with anyone during that time.  Her last episode of sexual course was over a month ago with one partner whom she was condoms.  She is advised this partner that she had trichomonas last time she was here.  No discharge.  Itching and burning.  Doesn't burn when she pees.   Sexually active a month ago.  Used condoms.  Started sometime this month.  Two weeks into this month.  No discharge.    ROS: See HPI for pertinent positives and negatives  Past Medical History  Family history reviewed for today's visit. No changes.  Social history- patient is a non smoker.    Objective: BP 124/82   Pulse 65   LMP 07/04/2011   SpO2 100%  Gen: NAD, alert and oriented, cooperative with exam CV: normal rate, regular rhythm. No murmurs, no rubs.  Resp: LCTAB, no wheezes, crackles. normal work of breathing GI: nontender to palpation, BS present, no guarding or organomegaly GU: no lesions, bleeding, or discharge seen on vaginal exam.  Skin: No rashes, no lesions Psych: Appropriate behavior  Assessment/Plan: Vaginal pain Patient complaining of persistent vaginal pain and itching that did not resolve after being treated for trichomonas during last visit.  No new partners in that time, has not been sexually active. No other symptoms, no discharge.  Vaginal exam unremarkable.  Wet prep did not show trich. Will give abx pending STI testing.   - gc/ct testing - pap smear    Clemetine Marker, MD PGY-2

## 2018-11-03 NOTE — Assessment & Plan Note (Signed)
Patient complaining of persistent vaginal pain and itching that did not resolve after being treated for trichomonas during last visit.  No new partners in that time, has not been sexually active. No other symptoms, no discharge.  Vaginal exam unremarkable.  Wet prep did not show trich. Will give abx pending STI testing.   - gc/ct testing - pap smear

## 2018-11-04 DIAGNOSIS — Z79899 Other long term (current) drug therapy: Secondary | ICD-10-CM | POA: Diagnosis not present

## 2018-11-04 LAB — CYTOLOGY - PAP
Chlamydia: NEGATIVE
Diagnosis: NEGATIVE
HPV: DETECTED — AB
Neisseria Gonorrhea: NEGATIVE

## 2018-11-06 DIAGNOSIS — Z79899 Other long term (current) drug therapy: Secondary | ICD-10-CM | POA: Diagnosis not present

## 2018-11-07 ENCOUNTER — Telehealth: Payer: Self-pay | Admitting: *Deleted

## 2018-11-07 NOTE — Telephone Encounter (Signed)
Called patient to discuss results of recent Pap and STI testing.  Got voicemail.  Left voice message telling patient that if she would like to know the results she can call back to the office and that I would leave a message in her chart explaining the results to her, which could be read to her by the staff member in the office.  The patient is negative for gonorrhea and chlamydia.  Her Pap smear did not show any evidence of cervical cancer.  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  In this situation with no evidence of cervical malignancy but with positive HPV testing we recommend the patient get a follow-up Pap smear in 1 year.  If she is still having symptoms, she can make an appointment in our office for further work-up.

## 2018-11-07 NOTE — Telephone Encounter (Signed)
Pt is requesting results of pap and GC.  Message sent to lab to check status of GC.  Will forward to provider who ordered labs.   Christen Bame, CMA

## 2018-11-07 NOTE — Telephone Encounter (Signed)
Pt informed.  Jessica Fleeger, CMA  

## 2018-11-11 DIAGNOSIS — Z79899 Other long term (current) drug therapy: Secondary | ICD-10-CM | POA: Diagnosis not present

## 2018-11-13 DIAGNOSIS — Z79899 Other long term (current) drug therapy: Secondary | ICD-10-CM | POA: Diagnosis not present

## 2018-11-18 ENCOUNTER — Other Ambulatory Visit: Payer: Self-pay

## 2018-11-18 ENCOUNTER — Ambulatory Visit (HOSPITAL_COMMUNITY)
Admission: EM | Admit: 2018-11-18 | Discharge: 2018-11-18 | Disposition: A | Discharge status: Home / Self Care | Payer: Medicaid Other | Attending: Emergency Medicine | Admitting: Emergency Medicine

## 2018-11-18 ENCOUNTER — Encounter (HOSPITAL_COMMUNITY): Payer: Self-pay

## 2018-11-18 DIAGNOSIS — L02413 Cutaneous abscess of right upper limb: Secondary | ICD-10-CM

## 2018-11-18 DIAGNOSIS — Z79899 Other long term (current) drug therapy: Secondary | ICD-10-CM | POA: Diagnosis not present

## 2018-11-18 DIAGNOSIS — B9689 Other specified bacterial agents as the cause of diseases classified elsewhere: Secondary | ICD-10-CM | POA: Diagnosis not present

## 2018-11-18 DIAGNOSIS — L0291 Cutaneous abscess, unspecified: Secondary | ICD-10-CM

## 2018-11-18 MED ORDER — SULFAMETHOXAZOLE-TRIMETHOPRIM 800-160 MG PO TABS: 1.0000 | ORAL_TABLET | Freq: Two times a day (BID) | ORAL | 0 refills | Status: AC

## 2018-11-18 NOTE — ED Provider Notes (Signed)
[facility]    CSN: [id number] Arrival date & time: 11/18/18  1056     History   Chief Complaint Chief Complaint  Patient presents with  . Abscess    Right Underarm    HPI Mallory Lin is a 48 y.o. female.   Patient presents with abscess on her right upper arm x2 weeks; she states it has been recurrent in this same area for several months but she has been treating it at home.  She has a history of abscesses on her left axilla which required surgical intervention she reports.  She denies fever, chills, other symptoms.  LMP: post menopausal.   The history is provided by the patient.    Past Medical History:  Diagnosis Date  . Recurrent boils   . UTI (lower urinary tract infection)     Patient Active Problem List   Diagnosis Date Noted  . Vaginal pain 11/03/2018  . Trichimoniasis 10/21/2018  . Vaginal discharge 03/19/2017  . Vaginal spotting 12/19/2016  . Yeast vaginitis 12/16/2014  . Mild anemia 03/26/2013  . Hematuria 03/26/2013  . Dysuria 03/10/2013  . Hot flashes 03/10/2013  . Overweight (BMI 25.0-29.9) 01/15/2013  . Annual physical exam 01/15/2013  . Hidradenitis suppurativa 11/24/2010  . BOILS, RECURRENT 01/12/2008    Past Surgical History:  Procedure Laterality Date  . HYDRADENITIS EXCISION Bilateral 06/06/2012   Procedure: EXCISION HYDRADENITIS AXILLA;  Surgeon: Velora Hecklerodd M Gerkin, MD;  Location: [facility];  Service: General;  Laterality: Bilateral;  . INCISE AND DRAIN ABCESS     for hidradenitis  . INCISION AND DRAINAGE ABSCESS ANAL    . teeth extracted    . TUBAL LIGATION      OB History   No obstetric history on file.      Home Medications    Prior to Admission medications   Medication Sig Start Date End Date Taking? Authorizing Provider  sulfamethoxazole-trimethoprim (BACTRIM DS) 800-160 MG tablet Take 1 tablet by mouth 2 (two) times daily for 7 days. 11/18/18 11/25/18  Mickie Bailate, Austen Wygant H, NP    Family History History  reviewed. No pertinent family history.  Social History Social History   Tobacco Use  . Smoking status: Never Smoker  . Smokeless tobacco: Never Used  Substance Use Topics  . Alcohol use: No  . Drug use: No     Allergies   Patient has no known allergies.   Review of Systems Review of Systems  Constitutional: Negative for chills and fever.  HENT: Negative for ear pain and sore throat.   Eyes: Negative for pain and visual disturbance.  Respiratory: Negative for cough and shortness of breath.   Cardiovascular: Negative for chest pain and palpitations.  Gastrointestinal: Negative for abdominal pain and vomiting.  Genitourinary: Negative for dysuria and hematuria.  Musculoskeletal: Negative for arthralgias and back pain.  Skin: Positive for wound. Negative for color change and rash.  Neurological: Negative for seizures and syncope.  All other systems reviewed and are negative.    Physical Exam Triage Vital Signs ED Triage Vitals [11/18/18 1110]  Enc Vitals Group     BP (!) 164/88     Pulse Rate 68     Resp 18     Temp 98.1 F (36.7 C)     Temp Source Oral     SpO2 99 %     Weight      Height      Head Circumference      Peak Flow  Pain Score      Pain Loc      Pain Edu?      Excl. in [place]?    No data found.  Updated Vital Signs BP (!) 164/88 (BP Location: Left Arm)   Pulse 68   Temp 98.1 F (36.7 C) (Oral)   Resp 18   LMP 07/04/2011   SpO2 99%   Visual Acuity Right Eye Distance:   Left Eye Distance:   Bilateral Distance:    Right Eye Near:   Left Eye Near:    Bilateral Near:     Physical Exam Vitals signs and nursing note reviewed.  Constitutional:      General: She is not in acute distress.    Appearance: She is well-developed.  HENT:     Head: Normocephalic and atraumatic.  Eyes:     Conjunctiva/sclera: Conjunctivae normal.  Neck:     Musculoskeletal: Neck supple.  Cardiovascular:     Rate and Rhythm: Normal rate and regular rhythm.      Heart sounds: No murmur.  Pulmonary:     Effort: Pulmonary effort is normal. No respiratory distress.     Breath sounds: Normal breath sounds.  Abdominal:     Palpations: Abdomen is soft.     Tenderness: There is no abdominal tenderness.  Musculoskeletal: Normal range of motion.  Skin:    General: Skin is warm and dry.     Capillary Refill: Capillary refill takes less than 2 seconds.     Findings: Erythema and lesion present.     Comments: Abscess on right upper arm with surrounding erythema.  See picture for details.    Neurological:     Mental Status: She is alert.     Sensory: No sensory deficit.     Motor: No weakness.        UC Treatments / Results  Labs (all labs ordered are listed, but only abnormal results are displayed) Labs Reviewed - No data to display  EKG   Radiology No results found.  Procedures Incision and Drainage  Date/Time: 11/18/2018 12:15 PM Performed by: Sharion Balloon, NP Authorized by: Sharion Balloon, NP   Consent:    Consent obtained:  Verbal   Consent given by:  Patient Location:    Type:  Abscess   Location:  Upper extremity   Upper extremity location:  Arm   Arm location:  R upper arm Pre-procedure details:    Skin preparation:  Betadine Anesthesia (see MAR for exact dosages):    Anesthesia method:  Local infiltration   Local anesthetic:  Lidocaine 2% WITH epi Procedure details:    Incision types:  Single straight   Scalpel blade:  11   Drainage:  Purulent   Drainage amount:  Moderate   Wound treatment:  Wound left open   Packing materials:  1/4 in iodoform gauze Post-procedure details:    Patient tolerance of procedure:  Tolerated well, no immediate complications   (including critical care time)  Medications Ordered in UC Medications - No data to display  Initial Impression / Assessment and Plan / UC Course  I have reviewed the triage vital signs and the nursing notes.  Pertinent labs & imaging results that were  available during my care of the patient were reviewed by me and considered in my medical decision making (see chart for details).   Abscess on right arm.  I&D performed.  Treating with Septra DS.  Wound care instructions and signs of infection discussed with  patient.  Instructed patient to call her surgeon to make an appointment for tomorrow; discussed with patient that if she is unable to be seen tomorrow at the surgeon's office, that she should return here for a wound recheck.     Final Clinical Impressions(s) / UC Diagnoses   Final diagnoses:  Abscess     Discharge Instructions     Call today to make an appointment for tomorrow with your surgeon or the surgeon listed below.  If you are unable to get an appointment with the surgeon for tomorrow, return here for a wound recheck.  Your wound has been packed to keep it from closing.  Allow it to drain.  Keep the wound clean and dry.    Watch for signs of infection such as fever, chills, increased redness, increased pain, increased warmth, red streaks.  If you notice signs of infection, return here or go to the emergency department.        ED Prescriptions    Medication Sig Dispense Auth. Provider   sulfamethoxazole-trimethoprim (BACTRIM DS) 800-160 MG tablet Take 1 tablet by mouth 2 (two) times daily for 7 days. 14 tablet Mickie Bailate, Angelissa Supan H, NP     Controlled Substance Prescriptions [place] Controlled Substance Registry consulted? Not Applicable   Mickie Bailate, Haddy Mullinax H, NP 11/18/18 1217

## 2018-11-18 NOTE — Discharge Instructions (Addendum)
Call today to make an appointment for tomorrow with your surgeon or the surgeon listed below.  If you are unable to get an appointment with the surgeon for tomorrow, return here for a wound recheck.  Your wound has been packed to keep it from closing.  Allow it to drain.  Keep the wound clean and dry.    Watch for signs of infection such as fever, chills, increased redness, increased pain, increased warmth, red streaks.  If you notice signs of infection, return here or go to the emergency department.

## 2018-11-18 NOTE — ED Triage Notes (Signed)
Pt presents with abscess under right arm area X 14 days.

## 2018-11-20 ENCOUNTER — Ambulatory Visit (HOSPITAL_COMMUNITY)
Admission: EM | Admit: 2018-11-20 | Discharge: 2018-11-20 | Disposition: A | Discharge status: Home / Self Care | Payer: Medicaid Other

## 2018-11-20 ENCOUNTER — Other Ambulatory Visit: Payer: Self-pay

## 2018-11-20 ENCOUNTER — Encounter (HOSPITAL_COMMUNITY): Payer: Self-pay

## 2018-11-20 DIAGNOSIS — Z5189 Encounter for other specified aftercare: Secondary | ICD-10-CM

## 2018-11-20 DIAGNOSIS — Z79899 Other long term (current) drug therapy: Secondary | ICD-10-CM | POA: Diagnosis not present

## 2018-11-20 NOTE — ED Provider Notes (Signed)
[facility]    CSN: [id number] Arrival date & time: 11/20/18  1005     History   Chief Complaint Chief Complaint  Patient presents with  . Wound Check    HPI Unk [name] is a 48 y.o. female.   HPI Unk [name] is a 48 y.o. female presenting to UC with request for a wound check/packing removal after having abscess on Right upper arm I&D at this urgent care on 11/18/2018.  She has been taking her bactrim as prescribed w/o issue. She reports calling her general surgeon multiple times since visit in UC but states no one will answer the phone and she cannot leave a message because the voice mailbox is full.  Denies fever, chills, n/v/d.    Past Medical History:  Diagnosis Date  . Recurrent boils   . UTI (lower urinary tract infection)     Patient Active Problem List   Diagnosis Date Noted  . Vaginal pain 11/03/2018  . Trichimoniasis 10/21/2018  . Vaginal discharge 03/19/2017  . Vaginal spotting 12/19/2016  . Yeast vaginitis 12/16/2014  . Mild anemia 03/26/2013  . Hematuria 03/26/2013  . Dysuria 03/10/2013  . Hot flashes 03/10/2013  . Overweight (BMI 25.0-29.9) 01/15/2013  . Annual physical exam 01/15/2013  . Hidradenitis suppurativa 11/24/2010  . BOILS, RECURRENT 01/12/2008    Past Surgical History:  Procedure Laterality Date  . HYDRADENITIS EXCISION Bilateral 06/06/2012   Procedure: EXCISION HYDRADENITIS AXILLA;  Surgeon: Velora Hecklerodd M Gerkin, MD;  Location: [facility];  Service: General;  Laterality: Bilateral;  . INCISE AND DRAIN ABCESS     for hidradenitis  . INCISION AND DRAINAGE ABSCESS ANAL    . teeth extracted    . TUBAL LIGATION      OB History   No obstetric history on file.      Home Medications    Prior to Admission medications   Medication Sig Start Date End Date Taking? Authorizing Provider  sulfamethoxazole-trimethoprim (BACTRIM DS) 800-160 MG tablet Take 1 tablet by mouth 2 (two) times daily for 7 days. 11/18/18  11/25/18  Mickie Bailate, Kelly H, NP    Family History History reviewed. No pertinent family history.  Social History Social History   Tobacco Use  . Smoking status: Never Smoker  . Smokeless tobacco: Never Used  Substance Use Topics  . Alcohol use: No  . Drug use: No     Allergies   Patient has no known allergies.   Review of Systems Review of Systems  Constitutional: Negative for chills and fever.  Gastrointestinal: Negative for nausea and vomiting.     Physical Exam Triage Vital Signs ED Triage Vitals  Enc Vitals Group     BP 11/20/18 1045 (!) 142/66     Pulse Rate 11/20/18 1045 68     Resp 11/20/18 1045 18     Temp 11/20/18 1045 98.2 F (36.8 C)     Temp Source 11/20/18 1045 Oral     SpO2 11/20/18 1045 100 %     Weight 11/20/18 1044 175 lb (79.4 kg)     Height --      Head Circumference --      Peak Flow --      Pain Score 11/20/18 1044 0     Pain Loc --      Pain Edu? --      Excl. in [place]? --    No data found.  Updated Vital Signs BP (!) 142/66 (BP Location: Right Arm)  Pulse 68   Temp 98.2 F (36.8 C) (Oral)   Resp 18   Wt 175 lb (79.4 kg)   LMP 07/04/2011   SpO2 100%   BMI 26.61 kg/m   Visual Acuity Right Eye Distance:   Left Eye Distance:   Bilateral Distance:    Right Eye Near:   Left Eye Near:    Bilateral Near:     Physical Exam Vitals signs and nursing note reviewed.  Constitutional:      Appearance: Normal appearance. She is well-developed.  HENT:     Head: Normocephalic and atraumatic.  Neck:     Musculoskeletal: Normal range of motion.  Cardiovascular:     Rate and Rhythm: Normal rate.  Pulmonary:     Effort: Pulmonary effort is normal.  Musculoskeletal: Normal range of motion.  Skin:    General: Skin is warm and dry.     Findings: Lesion present.     Comments: Right upper arm: 3cm open wound, scant purulent discharge. Mildly tender.   Neurological:     Mental Status: She is alert and oriented to person, place, and time.   Psychiatric:        Behavior: Behavior normal.      UC Treatments / Results  Labs (all labs ordered are listed, but only abnormal results are displayed) Labs Reviewed - No data to display  EKG   Radiology No results found.  Procedures Procedures (including critical care time)  Medications Ordered in UC Medications - No data to display  Initial Impression / Assessment and Plan / UC Course  I have reviewed the triage vital signs and the nursing notes.  Pertinent labs & imaging results that were available during my care of the patient were reviewed by me and considered in my medical decision making (see chart for details).     Packing removed. New dressing applied. Continue antibiotics F/u with PCP.  Final Clinical Impressions(s) / UC Diagnoses   Final diagnoses:  Wound check, abscess     Discharge Instructions      Please take antibiotics as prescribed and be sure to complete entire course even if you start to feel better to ensure infection does not come back.  Please follow up with your family doctor early next week for recheck of your wound and for referral to surgery if needed.    ED Prescriptions    None     Controlled Substance Prescriptions [place] Controlled Substance Registry consulted? Not Applicable   Tyrell Antonio 11/20/18 1246

## 2018-11-20 NOTE — ED Triage Notes (Signed)
Pt came to get her packing removed. Pt has it in her right arm.

## 2018-11-20 NOTE — Discharge Instructions (Signed)
°  Please take antibiotics as prescribed and be sure to complete entire course even if you start to feel better to ensure infection does not come back.  Please follow up with your family doctor early next week for recheck of your wound and for referral to surgery if needed.

## 2018-11-25 DIAGNOSIS — Z79899 Other long term (current) drug therapy: Secondary | ICD-10-CM | POA: Diagnosis not present

## 2018-11-27 DIAGNOSIS — Z79899 Other long term (current) drug therapy: Secondary | ICD-10-CM | POA: Diagnosis not present

## 2018-12-02 DIAGNOSIS — Z79899 Other long term (current) drug therapy: Secondary | ICD-10-CM | POA: Diagnosis not present

## 2018-12-04 DIAGNOSIS — Z79899 Other long term (current) drug therapy: Secondary | ICD-10-CM | POA: Diagnosis not present

## 2018-12-08 DIAGNOSIS — Z79899 Other long term (current) drug therapy: Secondary | ICD-10-CM | POA: Diagnosis not present

## 2018-12-10 DIAGNOSIS — Z79899 Other long term (current) drug therapy: Secondary | ICD-10-CM | POA: Diagnosis not present

## 2018-12-15 DIAGNOSIS — Z79899 Other long term (current) drug therapy: Secondary | ICD-10-CM | POA: Diagnosis not present

## 2018-12-17 DIAGNOSIS — Z79899 Other long term (current) drug therapy: Secondary | ICD-10-CM | POA: Diagnosis not present

## 2019-02-25 ENCOUNTER — Ambulatory Visit (HOSPITAL_COMMUNITY)
Admission: EM | Admit: 2019-02-25 | Discharge: 2019-02-25 | Disposition: A | Discharge status: Home / Self Care | Payer: Medicaid Other | Attending: Family Medicine | Admitting: Family Medicine

## 2019-02-25 ENCOUNTER — Encounter (HOSPITAL_COMMUNITY): Payer: Self-pay

## 2019-02-25 ENCOUNTER — Other Ambulatory Visit: Payer: Self-pay

## 2019-02-25 DIAGNOSIS — R600 Localized edema: Secondary | ICD-10-CM | POA: Insufficient documentation

## 2019-02-25 DIAGNOSIS — R030 Elevated blood-pressure reading, without diagnosis of hypertension: Secondary | ICD-10-CM | POA: Diagnosis not present

## 2019-02-25 LAB — BASIC METABOLIC PANEL
Anion gap: 6 (ref 5–15)
BUN: 7 mg/dL (ref 6–20)
CO2: 27 mmol/L (ref 22–32)
Calcium: 8.4 mg/dL — ABNORMAL LOW (ref 8.9–10.3)
Chloride: 109 mmol/L (ref 98–111)
Creatinine, Ser: 0.81 mg/dL (ref 0.44–1.00)
GFR calc Af Amer: >60 mL/min (ref >60)
GFR calc non Af Amer: >60 mL/min (ref >60)
Glucose, Bld: 85 mg/dL (ref 70–99)
Potassium: 3.9 mmol/L (ref 3.5–5.1)
Sodium: 142 mmol/L (ref 135–145)

## 2019-02-25 LAB — POCT URINALYSIS DIP (DEVICE)
Bilirubin Urine: NEGATIVE
Glucose, UA: NEGATIVE mg/dL
Ketones, ur: NEGATIVE mg/dL
Leukocytes,Ua: NEGATIVE
Nitrite: NEGATIVE
Protein, ur: >=300 mg/dL — AB
Specific Gravity, Urine: 1.025 (ref 1.005–1.030)
Urobilinogen, UA: 1 mg/dL (ref 0.0–1.0)
pH: 7 (ref 5.0–8.0)

## 2019-02-25 MED ORDER — FUROSEMIDE 20 MG PO TABS: ORAL_TABLET | ORAL | 0 refills | Status: DC

## 2019-02-25 NOTE — ED Provider Notes (Signed)
[place]   [id number] 02/25/19 Arrival Time: 1003  ASSESSMENT & PLAN:  1. Edema of both lower extremities   2. Elevated blood pressure reading without diagnosis of hypertension     Much protein in urine. Question nephrotic syndrome. Discussed. BMP pending.  Trial of: Meds ordered this encounter  Medications  . furosemide (LASIX) 20 MG tablet    Sig: Take 1-2 tablets every morning for 5 days.    Dispense:  10 tablet    Refill:  0     Discharge Instructions     Swelling happens when fluid collects in small spaces around tissues and organs inside the body. Another word for swelling is "edema." Some common parts of the body where people can have swelling are the lower legs or hands. This typically is worse in the areas of the body that are closest to the ground (because of gravity)  Symptoms of swelling can include puffiness of the skin, which can cause the skin to look stretched and shiny. This often occurs with swelling in the lower legs and can be worse after you sit or stand for a long time.  Treatment of edema includes several components: treatment of the underlying cause (if possible), reducing the amount of salt (sodium) in your diet, and, in many cases, use of a medication called a diuretic to eliminate excess fluid. Using compression stockings and elevating the legs may also be recommended.   Your blood pressure was noted to be elevated during your visit today. You may return here within the next few days to recheck if unable to see your primary care doctor. If your blood pressure remains persistently elevated, you may need to begin taking a medication.  BP (!) 167/92 (BP Location: Right Arm)   Pulse 95   Temp 98.1 F (36.7 C) (Temporal)   Resp 16   Wt 83.9 kg   LMP 07/04/2011   SpO2 99%   BMI 28.13 kg/m     Recommend: Follow-up Information    Schedule an appointment as soon as possible for a visit  with Mullis, Kiersten P, DO.   Specialty: Family  Medicine Contact information: [address] [place] [address] [phone]            Reviewed expectations re: course of current medical issues. Questions answered. Outlined signs and symptoms indicating need for more acute intervention. Patient verbalized understanding. After Visit Summary given.   SUBJECTIVE: History from: patient. Mallory Lin is a 48 y.o. female who presents with complaint of intermittent bilateral LE edema. Onset gradual, first noticed approx one month ago. Symptoms are gradually worsening since beginning. Fever: absent. No recent illnesses. No associated SOB/CP. Aggravating factors: have not been identified. Alleviating factors: have not been identified. She denies arthralgias, constipation, diarrhea, dysuria, headache, hematuria, myalgias and sweats. Appetite: normal. PO intake: normal. Ambulatory without assistance. Urinary symptoms: none. Bowel movements: have not significantly changed; last bowel movement yesterday and without blood. History of similar: no. OTC treatment: none; no new medications reported.  Patient's last menstrual period was 07/04/2011.   Past Surgical History:  Procedure Laterality Date  . HYDRADENITIS EXCISION Bilateral 06/06/2012   Procedure: EXCISION HYDRADENITIS AXILLA;  Surgeon: Earnstine Regal, MD;  Location: [facility];  Service: General;  Laterality: Bilateral;  . INCISE AND DRAIN ABCESS     for hidradenitis  . INCISION AND DRAINAGE ABSCESS ANAL    . teeth extracted    . TUBAL LIGATION  ROS: As per HPI. All other systems negative.  OBJECTIVE:  Vitals:   02/25/19 1046 02/25/19 1053  BP: (!) 167/92   Pulse: 95   Resp: 16   Temp: 98.1 F (36.7 C)   TempSrc: Temporal   SpO2: 99%   Weight:  83.9 kg    General appearance: alert, oriented, no acute distress HEENT: [place]; AT; oropharynx moist Lungs: clear to auscultation bilaterally; unlabored respirations Heart: regular rate and rhythm  Abdomen: soft; without distention; no specific tenderness to palpation; normal bowel sounds; without masses or organomegaly; without guarding or rebound tenderness Back: without CVA tenderness; FROM at waist Extremities: with 2+ bilateral LE edema; symmetrical; no calf swelling or tendneress Skin: warm and dry Neurologic: normal gait Psychological: alert and cooperative; normal mood and affect  Labs Pending: Labs Reviewed  BASIC METABOLIC PANEL    No Known Allergies                                             Past Medical History:  Diagnosis Date  . Recurrent boils   . UTI (lower urinary tract infection)    Social History   Socioeconomic History  . Marital status: Single    Spouse name: Not on file  . Number of children: Not on file  . Years of education: Not on file  . Highest education level: Not on file  Occupational History  . Not on file  Social Needs  . Financial resource strain: Not on file  . Food insecurity    Worry: Not on file    Inability: Not on file  . Transportation needs    Medical: Not on file    Non-medical: Not on file  Tobacco Use  . Smoking status: Never Smoker  . Smokeless tobacco: Never Used  Substance and Sexual Activity  . Alcohol use: No  . Drug use: No  . Sexual activity: Never    Birth control/protection: Surgical  Lifestyle  . Physical activity    Days per week: Not on file    Minutes per session: Not on file  . Stress: Not on file  Relationships  . Social Musician on phone: Not on file    Gets together: Not on file    Attends religious service: Not on file    Active member of club or organization: Not on file    Attends meetings of clubs or organizations: Not on file    Relationship status: Not on file  . Intimate partner violence    Fear of current or ex partner: Not on file    Emotionally abused: Not on file    Physically abused: Not on file    Forced sexual activity: Not on file  Other Topics Concern  . Not  on file  Social History Narrative  . Not on file   Family History  Problem Relation Age of Onset  . Healthy Mother   . Healthy Father      Mardella Layman, MD 02/26/19 [phone]

## 2019-02-25 NOTE — Discharge Instructions (Addendum)
Swelling happens when fluid collects in small spaces around tissues and organs inside the body. Another word for swelling is "edema." Some common parts of the body where people can have swelling are the lower legs or hands. This typically is worse in the areas of the body that are closest to the ground (because of gravity)  Symptoms of swelling can include puffiness of the skin, which can cause the skin to look stretched and shiny. This often occurs with swelling in the lower legs and can be worse after you sit or stand for a long time.  Treatment of edema includes several components: treatment of the underlying cause (if possible), reducing the amount of salt (sodium) in your diet, and, in many cases, use of a medication called a diuretic to eliminate excess fluid. Using compression stockings and elevating the legs may also be recommended.   Your blood pressure was noted to be elevated during your visit today. You may return here within the next few days to recheck if unable to see your primary care doctor. If your blood pressure remains persistently elevated, you may need to begin taking a medication.  BP (!) 167/92 (BP Location: Right Arm)    Pulse 95    Temp 98.1 F (36.7 C) (Temporal)    Resp 16    Wt 83.9 kg    LMP 07/04/2011    SpO2 99%    BMI 28.13 kg/m

## 2019-02-25 NOTE — ED Triage Notes (Signed)
Pt states her feet have been  swollen for a week. Pt states she has been soaking her feet in alcohol. Pt states its not working.

## 2019-02-27 ENCOUNTER — Ambulatory Visit (INDEPENDENT_AMBULATORY_CARE_PROVIDER_SITE_OTHER): Payer: Medicaid Other | Admitting: Family Medicine

## 2019-02-27 ENCOUNTER — Other Ambulatory Visit: Payer: Self-pay

## 2019-02-27 VITALS — BP 118/84 | HR 65 | Wt 187.0 lb

## 2019-02-27 DIAGNOSIS — M7989 Other specified soft tissue disorders: Secondary | ICD-10-CM

## 2019-02-27 DIAGNOSIS — R600 Localized edema: Secondary | ICD-10-CM | POA: Insufficient documentation

## 2019-02-27 HISTORY — DX: Localized edema: R60.0

## 2019-02-27 LAB — POCT URINALYSIS DIP (MANUAL ENTRY)
Bilirubin, UA: NEGATIVE
Glucose, UA: NEGATIVE mg/dL
Ketones, POC UA: NEGATIVE mg/dL
Leukocytes, UA: NEGATIVE
Nitrite, UA: NEGATIVE
Protein Ur, POC: >=300 mg/dL — AB
Spec Grav, UA: 1.025 (ref 1.010–1.025)
Urobilinogen, UA: 0.2 E.U./dL
pH, UA: 6.5 (ref 5.0–8.0)

## 2019-02-27 MED ORDER — FUROSEMIDE 20 MG PO TABS: 20.0000 mg | ORAL_TABLET | Freq: Every day | ORAL | 0 refills | Status: DC

## 2019-02-27 NOTE — Progress Notes (Signed)
   Subjective:    Patient ID: Mallory Lin, female    DOB: [birth year], 48 y.o.   MRN: [id number]   CC: legs swelling   HPI: Mallory Lin is a 48 year old female with elevated BMI and hidradenitis suppurativa presenting discuss the following:  LE edema: Insidious onset about 2 weeks ago and bilateral lower extremity.  Says right before she got stung by 2 yellow jackets on her left lower leg.  She has not had swelling like this before.  She was evaluated in the urgent care on 11/11 with urinalysis showing 300+ proteinuria and given Lasix 20 mg.  BMP unremarkable with appropriate creatinine on 11/11.  She has been taking Lasix with some improvement in her swelling.  She denies any associated shortness of breath, DOE, chest pain, orthopnea, palpitations, dysuria/gross hematuria, fever/chills, recent illness/sore throat/URI symptoms, abdominal/flank pain, or pain in her legs.  No known history of diabetes or hypertension.  Menopausal since 2013.  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  Consistently swollen throughout the entire day, does not notice worsening by the end of the day.  Not on any medications other than Lasix.  Smoking status reviewed  Review of Systems Per HPI    Objective:  BP 118/84   Pulse 65   Wt 187 lb (84.8 kg)   LMP 07/04/2011   SpO2 96%   BMI 28.43 kg/m  Vitals and nursing note reviewed  General: NAD, pleasant Cardiac: RRR, normal heart sounds, 1/6 systolic flow murmur Respiratory: CTAB, normal effort and satting appropriately on RA Abdomen: soft, nontender, nondistended Extremities/skin:  warm and dry, equal 2+ pitting edema to the bilateral lower extremities to below knee level.  Palpable dorsalis pedis and posterior tibialis pulses bilaterally.  No rashes noted. Neuro: alert and oriented, no focal deficits Psych: normal affect  Urine dipstick shows negative for all components, positive for large red blood cells, protein >=300.   Assessment & Plan:   Bilateral leg edema Acute onset, associated with significant proteinuria and hematuria concerning for nephrotic syndrome/glomerular abnormality.  No concern for heart failure without previous diagnosis or symptoms suggestive of this.  Does not take any medications other than Lasix currently, making medication side effect unlikely.  Consider thyroid abnormality, however without any additional symptoms concerning for hyperthyroidism this is also less likely.  Understandably if she has nephrotic syndrome she would be at a hypercoagulable state, however with bilateral and equal nonpainful swelling in this active female, low concern for DVTs at this time, but counseled on s/sx. Will place an urgent referral to nephrology and give a few additional days of Lasix.   Follow-up pending referral, ED precautions discussed.  [facility] Resident PGY-2

## 2019-02-27 NOTE — Assessment & Plan Note (Signed)
Acute onset, associated with significant proteinuria and hematuria concerning for nephrotic syndrome/glomerular abnormality.  No concern for heart failure without previous diagnosis or symptoms suggestive of this.  Does not take any medications other than Lasix currently, making medication side effect unlikely.  Consider thyroid abnormality, however without any additional symptoms concerning for hyperthyroidism this is also less likely.  Understandably if she has nephrotic syndrome she would be at a hypercoagulable state, however with bilateral and equal nonpainful swelling in this active female, low concern for DVTs at this time, but counseled on s/sx. Will place an urgent referral to nephrology and give a few additional days of Lasix.

## 2019-02-27 NOTE — Patient Instructions (Addendum)
It was wonderful to see you today.  I have sent in just a few more days of Lasix to be a week total.  I have also placed an urgent referral over to the kidney specialist for further evaluation given your new onset leg swelling with a lot of protein in your urine.  You should hear from them quite soon, if you have not heard anything in the next week please give [place] a call.  If you have any major swelling on one leg compared to the other with pain or shortness of breath, you need to go to the ED immediately.

## 2019-03-03 ENCOUNTER — Telehealth: Payer: Self-pay | Admitting: Family Medicine

## 2019-03-03 DIAGNOSIS — R600 Localized edema: Secondary | ICD-10-CM

## 2019-03-03 NOTE — Telephone Encounter (Signed)
Called patient with no answer and left voicemail.  After visit last week with concerns for renal etiology for bilateral leg edema, would actually like her to come in on a weekly basis for repeat labs until she is seen by nephrology.  If she calls back the clinic, can we get her scheduled for a lab visit this week?  Will also route this to PCP so that she can help coordinate if needed.  Future lab order already placed.  Mallory Clan, DO

## 2019-03-03 NOTE — Telephone Encounter (Signed)
Pt returned call.  Appt made for tomorrow for lab visit.  Pt is aware that we will likely be seeing her weekly.  Christen Bame, CMA

## 2019-03-04 ENCOUNTER — Other Ambulatory Visit: Payer: Self-pay

## 2019-03-04 ENCOUNTER — Other Ambulatory Visit: Payer: Medicaid Other

## 2019-03-04 DIAGNOSIS — R600 Localized edema: Secondary | ICD-10-CM

## 2019-03-05 ENCOUNTER — Other Ambulatory Visit: Payer: Self-pay | Admitting: Family Medicine

## 2019-03-05 DIAGNOSIS — R600 Localized edema: Secondary | ICD-10-CM

## 2019-03-05 LAB — COMPREHENSIVE METABOLIC PANEL
ALT: 11 IU/L (ref 0–32)
AST: 18 IU/L (ref 0–40)
Albumin/Globulin Ratio: 0.9 — ABNORMAL LOW (ref 1.2–2.2)
Albumin: 2.4 g/dL — ABNORMAL LOW (ref 3.8–4.8)
Alkaline Phosphatase: 121 IU/L — ABNORMAL HIGH (ref 39–117)
BUN/Creatinine Ratio: 14 (ref 9–23)
BUN: 11 mg/dL (ref 6–24)
Bilirubin Total: 0.4 mg/dL (ref 0.0–1.2)
CO2: 26 mmol/L (ref 20–29)
Calcium: 8.5 mg/dL — ABNORMAL LOW (ref 8.7–10.2)
Chloride: 106 mmol/L (ref 96–106)
Creatinine, Ser: 0.76 mg/dL (ref 0.57–1.00)
GFR calc Af Amer: 107 mL/min/{1.73_m2} (ref >59)
GFR calc non Af Amer: 93 mL/min/{1.73_m2} (ref >59)
Globulin, Total: 2.8 g/dL (ref 1.5–4.5)
Glucose: 70 mg/dL (ref 65–99)
Potassium: 3.6 mmol/L (ref 3.5–5.2)
Sodium: 142 mmol/L (ref 134–144)
Total Protein: 5.2 g/dL — ABNORMAL LOW (ref 6.0–8.5)

## 2019-03-05 LAB — LIPID PANEL
Chol/HDL Ratio: 4.1 ratio (ref 0.0–4.4)
Cholesterol, Total: 318 mg/dL — ABNORMAL HIGH (ref 100–199)
HDL: 77 mg/dL (ref >39)
LDL Chol Calc (NIH): 219 mg/dL — ABNORMAL HIGH (ref 0–99)
Triglycerides: 127 mg/dL (ref 0–149)
VLDL Cholesterol Cal: 22 mg/dL (ref 5–40)

## 2019-03-09 ENCOUNTER — Other Ambulatory Visit: Payer: Self-pay | Admitting: Family Medicine

## 2019-03-09 DIAGNOSIS — R600 Localized edema: Secondary | ICD-10-CM

## 2019-03-10 ENCOUNTER — Telehealth: Payer: Self-pay | Admitting: *Deleted

## 2019-03-10 NOTE — Telephone Encounter (Signed)
-----   Message from Mallory Clan, DO sent at 03/09/2019  5:21 PM EST ----- Can we please schedule her for a renal ultrasound to be after Thanksgiving?  Thank you!

## 2019-03-10 NOTE — Telephone Encounter (Signed)
Pt informed and scheduled. Antonieta Slaven, CMA  

## 2019-03-16 ENCOUNTER — Other Ambulatory Visit: Payer: Medicaid Other

## 2019-03-16 ENCOUNTER — Other Ambulatory Visit: Payer: Self-pay

## 2019-03-16 DIAGNOSIS — R600 Localized edema: Secondary | ICD-10-CM | POA: Diagnosis not present

## 2019-03-17 LAB — COMPREHENSIVE METABOLIC PANEL
ALT: 10 IU/L (ref 0–32)
AST: 16 IU/L (ref 0–40)
Albumin/Globulin Ratio: 0.8 — ABNORMAL LOW (ref 1.2–2.2)
Albumin: 2.4 g/dL — ABNORMAL LOW (ref 3.8–4.8)
Alkaline Phosphatase: 106 IU/L (ref 39–117)
BUN/Creatinine Ratio: 13 (ref 9–23)
BUN: 10 mg/dL (ref 6–24)
Bilirubin Total: 0.3 mg/dL (ref 0.0–1.2)
CO2: 25 mmol/L (ref 20–29)
Calcium: 8.5 mg/dL — ABNORMAL LOW (ref 8.7–10.2)
Chloride: 106 mmol/L (ref 96–106)
Creatinine, Ser: 0.75 mg/dL (ref 0.57–1.00)
GFR calc Af Amer: 109 mL/min/{1.73_m2} (ref >59)
GFR calc non Af Amer: 95 mL/min/{1.73_m2} (ref >59)
Globulin, Total: 2.9 g/dL (ref 1.5–4.5)
Glucose: 75 mg/dL (ref 65–99)
Potassium: 3.8 mmol/L (ref 3.5–5.2)
Sodium: 143 mmol/L (ref 134–144)
Total Protein: 5.3 g/dL — ABNORMAL LOW (ref 6.0–8.5)

## 2019-03-17 LAB — LIPID PANEL
Chol/HDL Ratio: 4.8 ratio — ABNORMAL HIGH (ref 0.0–4.4)
Cholesterol, Total: 353 mg/dL — ABNORMAL HIGH (ref 100–199)
HDL: 74 mg/dL (ref >39)
LDL Chol Calc (NIH): 234 mg/dL — ABNORMAL HIGH (ref 0–99)
Triglycerides: 224 mg/dL — ABNORMAL HIGH (ref 0–149)
VLDL Cholesterol Cal: 45 mg/dL — ABNORMAL HIGH (ref 5–40)

## 2019-03-18 LAB — PROTEIN / CREATININE RATIO, URINE
Creatinine, Urine: 186.4 mg/dL
Protein, Ur: 1125.6 mg/dL
Protein/Creat Ratio: 6039 mg/g creat — ABNORMAL HIGH (ref 0–200)

## 2019-03-19 ENCOUNTER — Telehealth: Payer: Self-pay | Admitting: Family Medicine

## 2019-03-19 ENCOUNTER — Other Ambulatory Visit: Payer: Self-pay

## 2019-03-19 ENCOUNTER — Ambulatory Visit (HOSPITAL_COMMUNITY)
Admission: RE | Admit: 2019-03-19 | Discharge: 2019-03-19 | Disposition: A | Discharge status: Home / Self Care | Payer: Medicaid Other | Source: Ambulatory Visit | Attending: Family Medicine | Admitting: Family Medicine

## 2019-03-19 DIAGNOSIS — N281 Cyst of kidney, acquired: Secondary | ICD-10-CM | POA: Diagnosis not present

## 2019-03-19 DIAGNOSIS — R600 Localized edema: Secondary | ICD-10-CM

## 2019-03-19 NOTE — Telephone Encounter (Signed)
Call patient discuss results.  Persistent proteinuria, approximated 24-hour protein 8 g/day, nephrotic range.  Still notable hyperlipidemia and hypoalbuminemia.  However she is no longer having any LE swelling.  Has appointment with nephrology on Monday, 12/7.  Look forward to following with this.   Patriciaann Clan, DO

## 2019-03-22 ENCOUNTER — Encounter: Payer: Self-pay | Admitting: Family Medicine

## 2019-03-23 DIAGNOSIS — R319 Hematuria, unspecified: Secondary | ICD-10-CM | POA: Diagnosis not present

## 2019-03-23 DIAGNOSIS — R600 Localized edema: Secondary | ICD-10-CM | POA: Diagnosis not present

## 2019-03-23 DIAGNOSIS — R809 Proteinuria, unspecified: Secondary | ICD-10-CM | POA: Diagnosis not present

## 2019-03-24 DIAGNOSIS — Z03818 Encounter for observation for suspected exposure to other biological agents ruled out: Secondary | ICD-10-CM | POA: Diagnosis not present

## 2019-03-24 DIAGNOSIS — Z1159 Encounter for screening for other viral diseases: Secondary | ICD-10-CM | POA: Diagnosis not present

## 2019-03-25 ENCOUNTER — Other Ambulatory Visit: Payer: Self-pay

## 2019-03-25 ENCOUNTER — Encounter (HOSPITAL_COMMUNITY): Payer: Self-pay | Admitting: Emergency Medicine

## 2019-03-25 ENCOUNTER — Emergency Department (HOSPITAL_COMMUNITY)
Admission: EM | Admit: 2019-03-25 | Discharge: 2019-03-26 | Disposition: A | Discharge status: Home / Self Care | Payer: Medicaid Other | Attending: Emergency Medicine | Admitting: Emergency Medicine

## 2019-03-25 DIAGNOSIS — R42 Dizziness and giddiness: Secondary | ICD-10-CM

## 2019-03-25 DIAGNOSIS — Z79899 Other long term (current) drug therapy: Secondary | ICD-10-CM | POA: Insufficient documentation

## 2019-03-25 DIAGNOSIS — U071 COVID-19: Secondary | ICD-10-CM | POA: Diagnosis not present

## 2019-03-25 DIAGNOSIS — R270 Ataxia, unspecified: Secondary | ICD-10-CM | POA: Diagnosis not present

## 2019-03-25 DIAGNOSIS — Z20828 Contact with and (suspected) exposure to other viral communicable diseases: Secondary | ICD-10-CM | POA: Diagnosis not present

## 2019-03-25 LAB — URINALYSIS, ROUTINE W REFLEX MICROSCOPIC
Bilirubin Urine: NEGATIVE
Glucose, UA: NEGATIVE mg/dL
Ketones, ur: NEGATIVE mg/dL
Leukocytes,Ua: NEGATIVE
Nitrite: NEGATIVE
Protein, ur: 100 mg/dL — AB
Specific Gravity, Urine: 1.027 (ref 1.005–1.030)
pH: 5 (ref 5.0–8.0)

## 2019-03-25 LAB — BASIC METABOLIC PANEL
Anion gap: 8 (ref 5–15)
BUN: 9 mg/dL (ref 6–20)
CO2: 25 mmol/L (ref 22–32)
Calcium: 7.7 mg/dL — ABNORMAL LOW (ref 8.9–10.3)
Chloride: 108 mmol/L (ref 98–111)
Creatinine, Ser: 0.85 mg/dL (ref 0.44–1.00)
GFR calc Af Amer: >60 mL/min (ref >60)
GFR calc non Af Amer: >60 mL/min (ref >60)
Glucose, Bld: 93 mg/dL (ref 70–99)
Potassium: 4.1 mmol/L (ref 3.5–5.1)
Sodium: 141 mmol/L (ref 135–145)

## 2019-03-25 LAB — CBC
HCT: 39.4 % (ref 36.0–46.0)
Hemoglobin: 12.3 g/dL (ref 12.0–15.0)
MCH: 29.5 pg (ref 26.0–34.0)
MCHC: 31.2 g/dL (ref 30.0–36.0)
MCV: 94.5 fL (ref 80.0–100.0)
Platelets: 268 10*3/uL (ref 150–400)
RBC: 4.17 MIL/uL (ref 3.87–5.11)
RDW: 12.9 % (ref 11.5–15.5)
WBC: 5.9 10*3/uL (ref 4.0–10.5)
nRBC: 0 % (ref 0.0–0.2)

## 2019-03-25 LAB — CBG MONITORING, ED: Glucose-Capillary: 77 mg/dL (ref 70–99)

## 2019-03-25 LAB — I-STAT BETA HCG BLOOD, ED (MC, WL, AP ONLY): I-stat hCG, quantitative: <5 m[IU]/mL (ref <5)

## 2019-03-25 MED ORDER — SODIUM CHLORIDE 0.9% FLUSH: 3.0000 mL | Freq: Once | INTRAVENOUS | Status: DC

## 2019-03-25 NOTE — ED Provider Notes (Signed)
MSE was initiated and I personally evaluated the patient and placed orders (if any) at  10:18 PM on March 25, 2019.  The patient appears stable so that the remainder of the MSE may be completed by another provider.  Patient here with c/o feeling light headed/ pre- syncopal for 5 days. She has had intermittent frontal headache rated at 6/10. She denies neuro sxs. She has had mild nausea. No photo/phonophobia or neck stiffness, fevers. She has tried nothing for her sxs. She denies cp., sob, melena. She has recently been referred to Nephrology for Nephrotic range proteinuria and hypoalbuminemia. She has was started on Lasix for peripheral edema and took her first dose today. Patient c/o loss of taste and smell.  Will start patient of Pre-syncope work up. Covid ordered. Will place on contact precautions.  Mallory Lin was evaluated in Emergency Department on 03/25/2019 for the symptoms described in the history of present illness. She was evaluated in the context of the global COVID-19 pandemic, which necessitated consideration that the patient might be at risk for infection with the SARS-CoV-2 virus that causes COVID-19. Institutional protocols and algorithms that pertain to the evaluation of patients at risk for COVID-19 are in a state of rapid change based on information released by regulatory bodies including the CDC and federal and state organizations. These policies and algorithms were followed during the patient's care in the ED.      EKG Interpretation  Date/Time:  Wednesday March 25 2019 15:45:32 EST Ventricular Rate:  64 PR Interval:  174 QRS Duration: 72 QT Interval:  382 QTC Calculation: 394 R Axis:   66 Text Interpretation: Normal sinus rhythm Normal ECG No STEMI Confirmed by Octaviano Glow [phone]) on 03/25/2019 7:15:24 PM         Margarita Mail, PA-C 03/25/19 [place], [place], DO 03/26/19 3790

## 2019-03-25 NOTE — ED Triage Notes (Signed)
C/o dizziness x 4 days and states everyday she feels like she is going to pass out.

## 2019-03-25 NOTE — ED Provider Notes (Signed)
[facility] EMERGENCY DEPARTMENT Provider Note   CSN: [id number] Arrival date & time: 03/25/19  1509     History   Chief Complaint Chief Complaint  Patient presents with  . Dizziness    HPI Mallory Lin is a 48 y.o. female.     Patient is a 48 year old female with past medical history of hidradenitis, dependent edema, and recent diagnosis of nephrotic syndrome.  She presents today for evaluation of dizziness.  Patient states that for the past 5 days she has waken in the morning feeling dizzy/lightheaded, and as if she is going to pass out.  This lasts throughout the day.  She denies a spinning sensation.  There is no worsening with change in position or turning her head.  She denies any ringing in her ears or loss of hearing.  She denies any visual disturbances.  She does report intermittent headaches, however Tylenol seems to help.  The history is provided by the patient.  Dizziness Quality:  Lightheadedness Severity:  Moderate Onset quality:  Sudden Duration:  5 days Timing:  Constant Progression:  Unchanged Chronicity:  New Relieved by:  Nothing Worsened by:  Nothing Ineffective treatments:  None tried   Past Medical History:  Diagnosis Date  . Recurrent boils   . UTI (lower urinary tract infection)     Patient Active Problem List   Diagnosis Date Noted  . Bilateral leg edema 02/27/2019  . Overweight (BMI 25.0-29.9) 01/15/2013  . Hidradenitis suppurativa 11/24/2010  . BOILS, RECURRENT 01/12/2008    Past Surgical History:  Procedure Laterality Date  . HYDRADENITIS EXCISION Bilateral 06/06/2012   Procedure: EXCISION HYDRADENITIS AXILLA;  Surgeon: Velora Heckler, MD;  Location: [facility];  Service: General;  Laterality: Bilateral;  . INCISE AND DRAIN ABCESS     for hidradenitis  . INCISION AND DRAINAGE ABSCESS ANAL    . teeth extracted    . TUBAL LIGATION       OB History   No obstetric history on file.      Home  Medications    Prior to Admission medications   Medication Sig Start Date End Date Taking? Authorizing Provider  furosemide (LASIX) 20 MG tablet Take 1 tablet (20 mg total) by mouth daily. 02/27/19   Allayne Stack, DO    Family History Family History  Problem Relation Age of Onset  . Healthy Mother   . Healthy Father     Social History Social History   Tobacco Use  . Smoking status: Never Smoker  . Smokeless tobacco: Never Used  Substance Use Topics  . Alcohol use: No  . Drug use: No     Allergies   Patient has no known allergies.   Review of Systems Review of Systems  Neurological: Positive for dizziness.  All other systems reviewed and are negative.    Physical Exam Updated Vital Signs BP (!) 144/87   Pulse 62   Temp 98.9 F (37.2 C) (Oral)   Resp 16   LMP 07/04/2011   SpO2 100%   Physical Exam Vitals signs and nursing note reviewed.  Constitutional:      General: She is not in acute distress.    Appearance: She is well-developed. She is not diaphoretic.  HENT:     Head: Normocephalic and atraumatic.  Eyes:     Extraocular Movements: Extraocular movements intact.     Pupils: Pupils are equal, round, and reactive to light.  Neck:     Musculoskeletal: Normal  range of motion and neck supple.  Cardiovascular:     Rate and Rhythm: Normal rate and regular rhythm.     Heart sounds: No murmur. No friction rub. No gallop.   Pulmonary:     Effort: Pulmonary effort is normal. No respiratory distress.     Breath sounds: Normal breath sounds. No wheezing.  Abdominal:     General: Bowel sounds are normal. There is no distension.     Palpations: Abdomen is soft.     Tenderness: There is no abdominal tenderness.  Musculoskeletal: Normal range of motion.  Skin:    General: Skin is warm and dry.  Neurological:     General: No focal deficit present.     Mental Status: She is alert and oriented to person, place, and time.     Cranial Nerves: No cranial  nerve deficit.     Motor: No weakness.     Coordination: Coordination normal.      ED Treatments / Results  Labs (all labs ordered are listed, but only abnormal results are displayed) Labs Reviewed  BASIC METABOLIC PANEL - Abnormal; Notable for the following components:      Result Value   Calcium 7.7 (*)    All other components within normal limits  URINALYSIS, ROUTINE W REFLEX MICROSCOPIC - Abnormal; Notable for the following components:   APPearance HAZY (*)    Hgb urine dipstick MODERATE (*)    Protein, ur 100 (*)    Bacteria, UA RARE (*)    All other components within normal limits  SARS CORONAVIRUS 2 (TAT 6-24 HRS)  CBC  CBG MONITORING, ED  I-STAT BETA HCG BLOOD, ED ([facility], [facility], [facility] ONLY)    EKG EKG Interpretation  Date/Time:  Wednesday March 25 2019 15:45:32 EST Ventricular Rate:  64 PR Interval:  174 QRS Duration: 72 QT Interval:  382 QTC Calculation: 394 R Axis:   66 Text Interpretation: Normal sinus rhythm Normal ECG No STEMI Confirmed by Octaviano Glow [phone]) on 03/25/2019 7:15:24 PM   Radiology No results found.  Procedures Procedures (including critical care time)  Medications Ordered in ED Medications  sodium chloride flush (NS) 0.9 % injection 3 mL (3 mLs Intravenous Refused 03/25/19 2246)     Initial Impression / Assessment and Plan / ED Course  I have reviewed the triage vital signs and the nursing notes.  Pertinent labs & imaging results that were available during my care of the patient were reviewed by me and considered in my medical decision making (see chart for details).  Clinical Course as of Mar 25 2315  Wed Mar 25, 2019  2219 Calcium(!): 7.7 [AH]    Clinical Course User Index [AH] Margarita Mail, PA-C   Patient with history of nephrotic syndrome presenting with complaints of dizziness as described in the HPI.  Patient's laboratory studies are essentially unremarkable.  She does have proteinuria which has been present on prior  UAs.  Electrolytes are otherwise unremarkable with the exception of a calcium of 7.7.  I am uncertain as to whether this is the cause of her symptoms, however I doubt this to be the case.  CT scan of the head is unremarkable and blood pressure is normal and there is no changes with orthostatics.  At this point, I feel as though an emergent cause of her symptoms has been ruled out.  I will have her follow-up with her primary doctor in the next few days.  Final Clinical Impressions(s) / ED Diagnoses   Final  diagnoses:  None    ED Discharge Orders    None       Geoffery Lyonselo, Yaroslav Gombos, MD 03/26/19 [phone]

## 2019-03-26 ENCOUNTER — Emergency Department (HOSPITAL_COMMUNITY): Payer: Medicaid Other

## 2019-03-26 DIAGNOSIS — R270 Ataxia, unspecified: Secondary | ICD-10-CM | POA: Diagnosis not present

## 2019-03-26 DIAGNOSIS — R42 Dizziness and giddiness: Secondary | ICD-10-CM | POA: Diagnosis not present

## 2019-03-26 LAB — SARS CORONAVIRUS 2 (TAT 6-24 HRS): SARS Coronavirus 2: POSITIVE — AB

## 2019-03-26 NOTE — Discharge Instructions (Signed)
Follow-up with your primary doctor in the next few days for a recheck, and return to the ER if symptoms significantly worsen or change.

## 2019-03-27 ENCOUNTER — Other Ambulatory Visit: Payer: Self-pay

## 2019-03-27 ENCOUNTER — Telehealth (INDEPENDENT_AMBULATORY_CARE_PROVIDER_SITE_OTHER): Payer: Medicaid Other | Admitting: Family Medicine

## 2019-03-27 DIAGNOSIS — U071 COVID-19: Secondary | ICD-10-CM | POA: Diagnosis not present

## 2019-03-27 HISTORY — DX: COVID-19: U07.1

## 2019-03-27 NOTE — Progress Notes (Signed)
[place] Telemedicine Visit  Patient consented to have virtual visit. Method of visit: Telephone  Encounter participants: Patient: Mallory Lin - located at home Provider: Patriciaann Clan - located at [facility] Others (if applicable): None   Chief Complaint: Questions about Covid  HPI: Mallory Lin is a 48 year old female with hidradenitis suppurativa and currently being worked up for nephrotic syndrome with nephrotic range proteinuria presenting discuss the following:  COVID-19 positive: Tested positive on 03/25/2019 for COVID-19 after presenting to the ED feeling lightheaded and febrile.  Symptoms started around approximately around 12/4 however atypical.  Tested before that time due to her symptoms at an outside facility but doesn't have results from that one yet. Son 70 years old, lives with her. He already got tested on wednedsay/tuesday for COVID, awaiting results.  She is wondering how she needs to handle this with her son, he is asymptomatic entirely.  It is just her and her son at home.   Patient can't taste or smell. A little winded at times, but otherwise breathing well. Feeling lightheaded on occasion.  Feels like she had a fever few days ago, however is afebrile now.  Occasional headache.  Denies any coughing, persistent shortness of breath, chest pain, nasal congestion/rhinorrhea.  States she did go to see her nephrologist on 12/7.  States they ran some tests, however she not gotten those results yet.  ROS: per HPI  Pertinent PMHx: See above.  Exam:  Respiratory: Able to speak in full sentences, unlabored breathing.  No coughing during exam.  Assessment/Plan:  COVID-19 virus infection Tested positive on 03/25/2019, symptoms seem to first appear around 12/4. Cold-like symptoms, however appears to be stable.  Recommended continued quarantine at least 10 days from onset with over 24 hours afebrile and majority of symptoms improving.  Additionally  discussed that she should isolate away from her son as much as possible and that he should quarantine for 14 days, his test is currently pending.  Discussed precautions including frequent handwashing, wearing a mask, and sanitizing surfaces.  ED precautions discussed including shortness of breath, chest pain, confusion.  Informed her to call if she has any further questions and gave her the [facility] website for additional resources as well.    Time spent during visit with patient: 11 minutes  Patriciaann Clan, DO  Family Medicine PGY-2

## 2019-03-27 NOTE — Assessment & Plan Note (Addendum)
Tested positive on 03/25/2019, symptoms seem to first appear around 12/4. Cold-like symptoms, however appears to be stable.  Recommended continued quarantine at least 10 days from onset with over 24 hours afebrile and majority of symptoms improving.  Additionally discussed that she should isolate away from her son as much as possible and that he should quarantine for 14 days, his test is currently pending.  Discussed precautions including frequent handwashing, wearing a mask, and sanitizing surfaces.  ED precautions discussed including shortness of breath, chest pain, confusion.  Informed her to call if she has any further questions and gave her the [facility] website for additional resources as well.

## 2019-03-30 ENCOUNTER — Telehealth (INDEPENDENT_AMBULATORY_CARE_PROVIDER_SITE_OTHER): Payer: Medicaid Other | Admitting: Family Medicine

## 2019-03-30 ENCOUNTER — Other Ambulatory Visit: Payer: Self-pay

## 2019-03-30 DIAGNOSIS — U071 COVID-19: Secondary | ICD-10-CM

## 2019-03-30 NOTE — Progress Notes (Signed)
[place] Telemedicine Visit  I connected with Mallory Lin on 03/31/19 at 11:25 AM EST by a video enabled telemedicine application and verified that I am speaking with the correct person using two identifiers.     I discussed the limitations of evaluation and management by telemedicine and the availability of in person appointments.  I discussed that the purpose of this telehealth visit is to provide medical care while limiting exposure to the novel coronavirus.  The patient expressed understanding and agreed to proceed.  Patient consented to have virtual visit. Method of visit: Video was attempted, but technology challenges prevented patient from using video, so visit was conducted via telephone.  Encounter participants: Patient: Mallory Lin - located at Home Provider: Danna Hefty - located at [facility] Others (if applicable): None  Chief Complaint: COVID follow up  HPI: Patient tested positive 03/25/19 with COVID. She notes she is still having lack of taste or smell. Occasional dizziness, otherwise denies any symptoms. Denies any SOB, fevers, chills, nausea, vomiting. She is eating and drinking normally. Voiding and stooling normally. Sleeping well. She plans to call to get results for son today. They are minimizing contact with each other, wearing masks and cleaning surfaces after each other.   ROS: per HPI  Pertinent PMHx:    Exam:  Respiratory: Speaking in full sentences. Breathing comfortably, unlabored breathing. No coughing during encounter.   Assessment/Plan:  COVID-19 virus infection Patient continues to be doing well at home. She is not experiencing any severe symptoms. She is under quarantine with son. They are minimizing contact with each other. CDC quarantine recommendations discussed again. She noted understanding and agreement. ED precautions discussing including development of SOB, chest pain, confusion, inability to maintain adequate  hydration. Patient to follow up if she fails to improve or if she has any questions or concerns.    Time spent during visit with patient: 15 minutes.  Danna Hefty, [place], PGY2 03/30/19

## 2019-03-31 NOTE — Assessment & Plan Note (Signed)
Patient continues to be doing well at home. She is not experiencing any severe symptoms. She is under quarantine with son. They are minimizing contact with each other. CDC quarantine recommendations discussed again. She noted understanding and agreement. ED precautions discussing including development of SOB, chest pain, confusion, inability to maintain adequate hydration. Patient to follow up if she fails to improve or if she has any questions or concerns.

## 2019-04-21 IMAGING — CT CT NECK W/ CM
4 of 5 series · 14 of 33 positions shown, 16 images · IV contrast (iopamidol)
Comparison: Head CT without contrast and cervical spine radiographs
10/01/2014.

CLINICAL DATA: 47-year-old female with fever sore throat and
headache for 2 days. Difficulty swallowing.

EXAM:
CT NECK WITH CONTRAST
TECHNIQUE: Multidetector CT imaging of the neck was performed using the
standard protocol following the bolus administration of intravenous
contrast.
CONTRAST:  75mL F94NQK-SII IOPAMIDOL (F94NQK-SII) INJECTION 61%

[Series 3: neck 2.0 i31s 3 · axial · 0.42mm/px · z∈[-256,-136]mm · 4 of 100 slices shown, 5 images]
[im 20/100  soft-tissue]
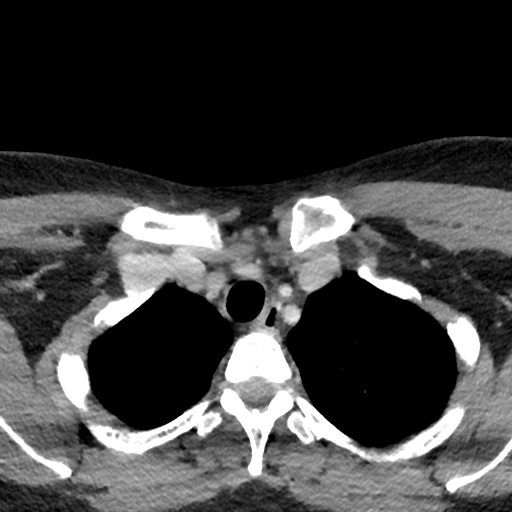
[im 20/100  bone]
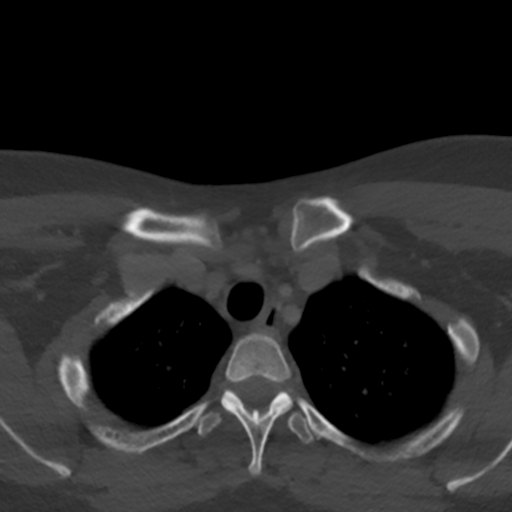
[im 40/100  bone]
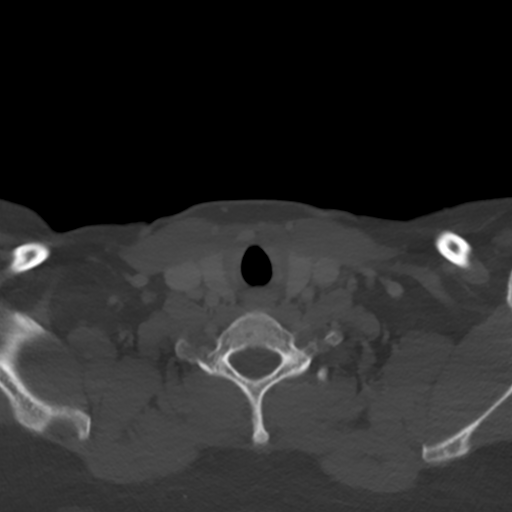
[im 60/100  bone]
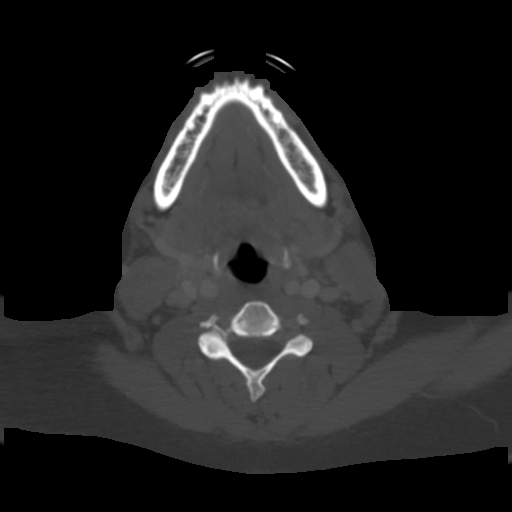
[im 80/100  bone]
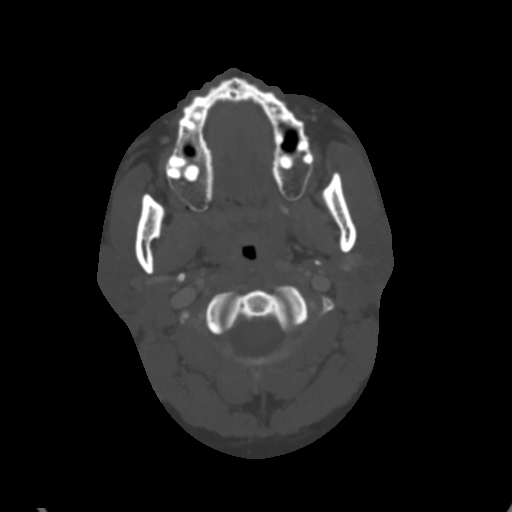

[Series 7: coronal st · coronal · 0.39mm/px · 3 of 118 slices shown]
[im 24/118  bone]
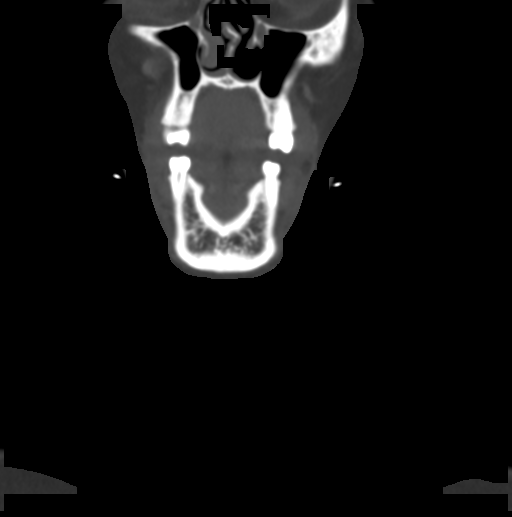
[im 47/118  bone]
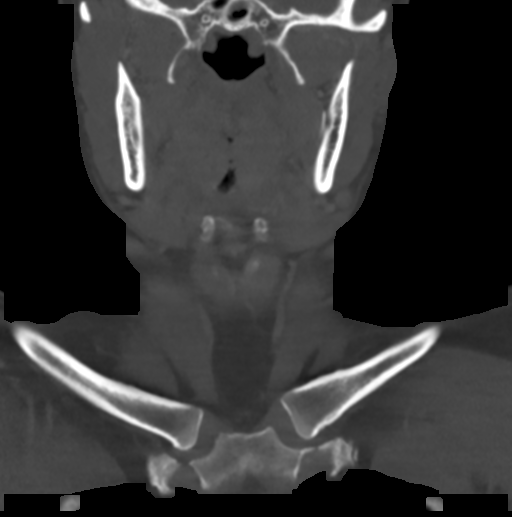
[im 71/118  bone]
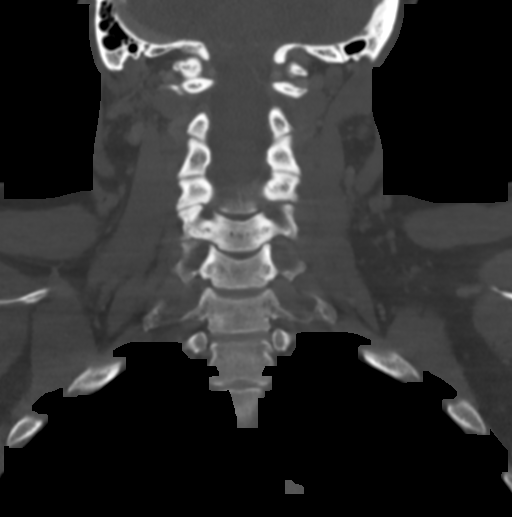

[Series 8: sagittal st · sagittal · 0.39mm/px · 5 of 85 slices shown, 6 images]
[im 29/85  bone]
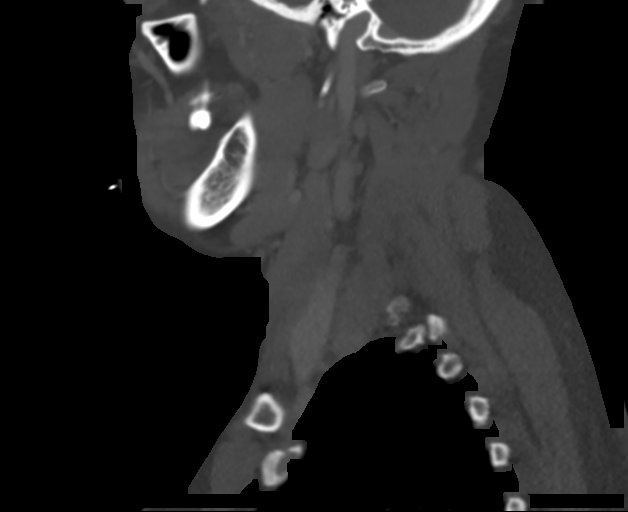
[im 36/85  bone]
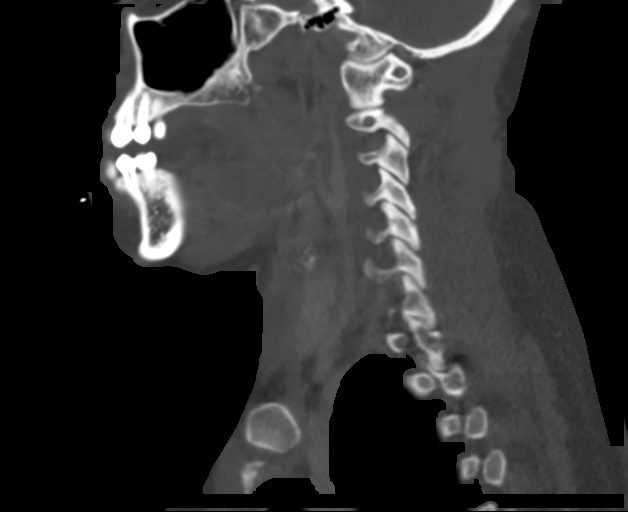
[im 43/85  soft-tissue]
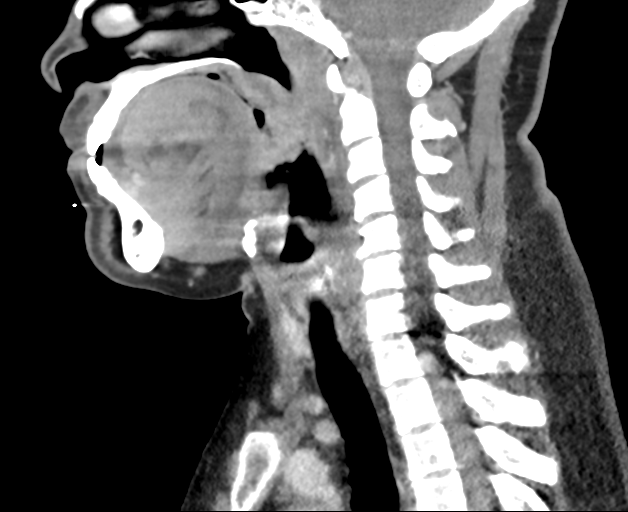
[im 43/85  bone]
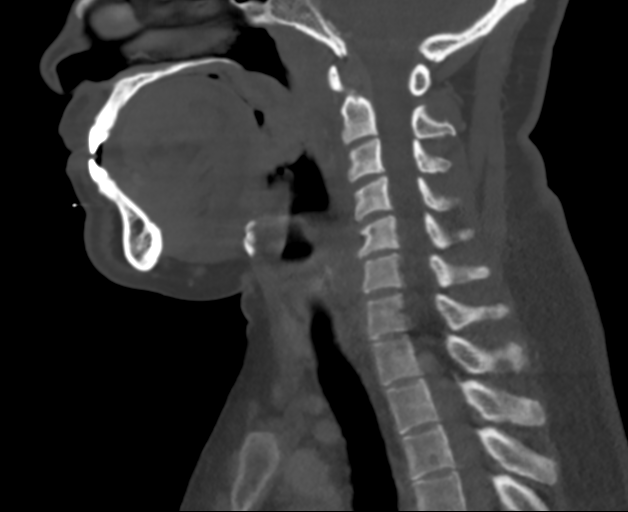
[im 50/85  bone]
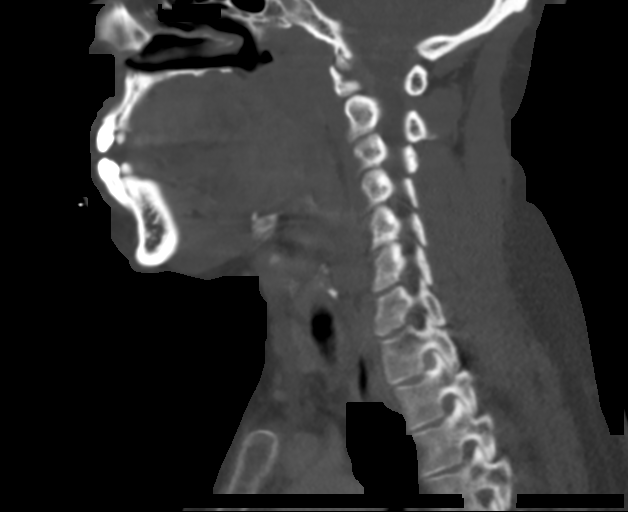
[im 57/85  bone]
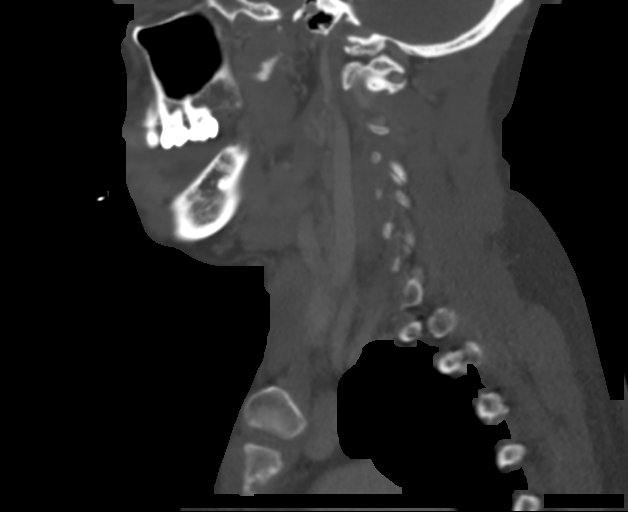

[Series 9: orthogonal st · axial · 0.39mm/px · z∈[-272,-235]mm · 2 of 98 slices shown]
[im 20/98  bone]
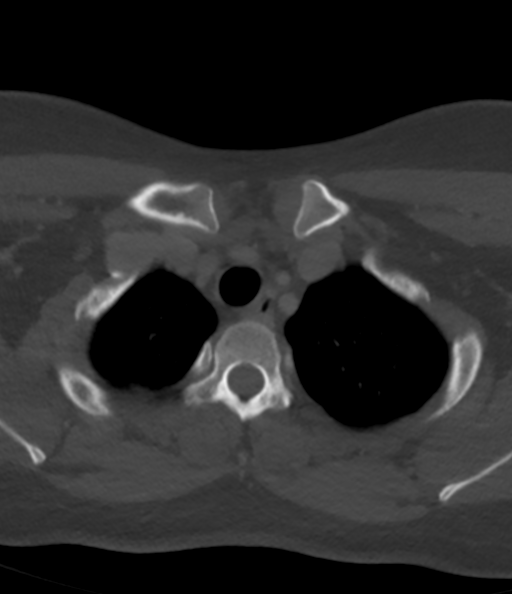
[im 39/98  bone]
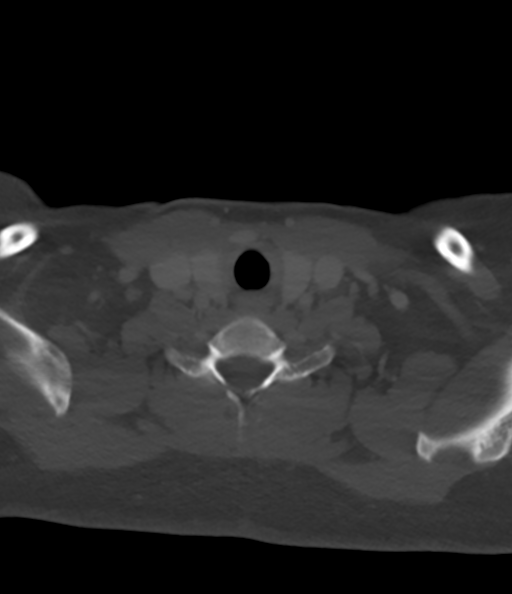

[14 of 33 positions shown; findings below may reference images not displayed]

FINDINGS: Pharynx and larynx: Motion artifact at the larynx and hypopharynx.
Subsequent blurring of the epiglottis. The other laryngeal contours
appear within normal limits.

Mild lingual tonsil hyperenhancement. Moderate palatine tonsil
enlargement bilaterally with opposed tonsils in the midline.
Expected tonsillar enhancement on the right. Questionable subtle
hypodensity within the left palatine tonsil (series 3, image 24),
however, the bilateral parapharyngeal spaces appear to remain normal
arguing against a peritonsillar abscess. Trace retropharyngeal
effusion. Mild adenoid hyperenhancement.

Salivary glands: Sublingual space, submandibular glands, and parotid
glands are within normal limits.

Thyroid: Negative.

Lymph nodes: Hyperenhancing bilateral level 2 lymph nodes, perhaps
more so on the left. The affected nodes are mildly enlarged up to
13-14 millimeter short axis. The level 3 nodes may be mildly
increased in number bilaterally.

Level 1 nodes are within normal limits. The level 4 and level 5
nodes appear normal.

No cystic or necrotic nodes.

Vascular: The major vascular structures in the neck and at the skull
base are patent, including the bilateral internal jugular veins.

Limited intracranial: Negative.

Visualized orbits: Negative.

Mastoids and visualized paranasal sinuses: Mild bubbly opacity in
the left sphenoid sinus. The maxillary sinuses are clear. Visible
tympanic cavities and mastoids are clear.

Skeleton: Previously extracted posterior mandible dentition. No
acute dental findings. No acute osseous abnormality identified.

Upper chest: Normal lung apices.  Normal superior mediastinum.
IMPRESSION: 1. Acute Tonsillitis suspected, but there is no convincing tonsillar
abscess. Recommend a repeat Neck CT with IV contrast if the patient
does not improve with treatment as expected.
2. Reactive lymphadenopathy with no suppurative lymph nodes.
Reactive mild retropharyngeal space effusion.

## 2019-04-23 DIAGNOSIS — U071 COVID-19: Secondary | ICD-10-CM | POA: Diagnosis not present

## 2019-04-23 DIAGNOSIS — R809 Proteinuria, unspecified: Secondary | ICD-10-CM | POA: Diagnosis not present

## 2019-04-23 DIAGNOSIS — R600 Localized edema: Secondary | ICD-10-CM | POA: Diagnosis not present

## 2019-04-27 ENCOUNTER — Other Ambulatory Visit (HOSPITAL_COMMUNITY): Payer: Self-pay | Admitting: Nephrology

## 2019-04-27 DIAGNOSIS — R809 Proteinuria, unspecified: Secondary | ICD-10-CM

## 2019-05-06 ENCOUNTER — Other Ambulatory Visit: Payer: Self-pay | Admitting: Radiology

## 2019-05-07 ENCOUNTER — Other Ambulatory Visit: Payer: Self-pay | Admitting: Radiology

## 2019-05-07 ENCOUNTER — Other Ambulatory Visit: Payer: Self-pay | Admitting: Student

## 2019-05-08 ENCOUNTER — Encounter (HOSPITAL_COMMUNITY): Payer: Self-pay

## 2019-05-08 ENCOUNTER — Ambulatory Visit (HOSPITAL_COMMUNITY)
Admission: RE | Admit: 2019-05-08 | Discharge: 2019-05-08 | Disposition: A | Discharge status: Home / Self Care | Payer: Medicaid Other | Source: Ambulatory Visit | Attending: Nephrology | Admitting: Nephrology

## 2019-05-08 ENCOUNTER — Other Ambulatory Visit: Payer: Self-pay

## 2019-05-08 DIAGNOSIS — R809 Proteinuria, unspecified: Secondary | ICD-10-CM | POA: Insufficient documentation

## 2019-05-08 DIAGNOSIS — Z79899 Other long term (current) drug therapy: Secondary | ICD-10-CM | POA: Insufficient documentation

## 2019-05-08 LAB — CBC
HCT: 40.8 % (ref 36.0–46.0)
Hemoglobin: 12.9 g/dL (ref 12.0–15.0)
MCH: 29.9 pg (ref 26.0–34.0)
MCHC: 31.6 g/dL (ref 30.0–36.0)
MCV: 94.4 fL (ref 80.0–100.0)
Platelets: 278 10*3/uL (ref 150–400)
RBC: 4.32 MIL/uL (ref 3.87–5.11)
RDW: 13.3 % (ref 11.5–15.5)
WBC: 8.1 10*3/uL (ref 4.0–10.5)
nRBC: 0 % (ref 0.0–0.2)

## 2019-05-08 LAB — PROTIME-INR
INR: 0.9 (ref 0.8–1.2)
Prothrombin Time: 12.3 seconds (ref 11.4–15.2)

## 2019-05-08 MED ORDER — MIDAZOLAM HCL 2 MG/2ML IJ SOLN
INTRAMUSCULAR | Status: AC
  Filled 2019-05-08: qty 2

## 2019-05-08 MED ORDER — HYDRALAZINE HCL 20 MG/ML IJ SOLN
INTRAMUSCULAR | Status: AC | PRN
  Administered 2019-05-08 (×2): 5 mg via INTRAVENOUS

## 2019-05-08 MED ORDER — SODIUM CHLORIDE 0.9 % IV SOLN
INTRAVENOUS | Status: AC | PRN
  Administered 2019-05-08: 10 mL/h via INTRAVENOUS

## 2019-05-08 MED ORDER — LIDOCAINE HCL (PF) 1 % IJ SOLN
INTRAMUSCULAR | Status: AC
  Filled 2019-05-08: qty 30

## 2019-05-08 MED ORDER — SODIUM CHLORIDE 0.9 % IV SOLN: INTRAVENOUS | Status: DC

## 2019-05-08 MED ORDER — ACETAMINOPHEN 325 MG PO TABS
ORAL_TABLET | ORAL | Status: AC
  Administered 2019-05-08: 650 mg via ORAL
  Filled 2019-05-08: qty 2

## 2019-05-08 MED ORDER — ACETAMINOPHEN 325 MG PO TABS
650.0000 mg | ORAL_TABLET | Freq: Once | ORAL | Status: AC
  Filled 2019-05-08: qty 2

## 2019-05-08 MED ORDER — FENTANYL CITRATE (PF) 100 MCG/2ML IJ SOLN
INTRAMUSCULAR | Status: AC | PRN
  Administered 2019-05-08 (×2): 50 ug via INTRAVENOUS

## 2019-05-08 MED ORDER — FENTANYL CITRATE (PF) 100 MCG/2ML IJ SOLN
INTRAMUSCULAR | Status: AC
  Filled 2019-05-08: qty 2

## 2019-05-08 MED ORDER — GELATIN ABSORBABLE 12-7 MM EX MISC
CUTANEOUS | Status: AC
  Filled 2019-05-08: qty 1

## 2019-05-08 MED ORDER — MIDAZOLAM HCL 2 MG/2ML IJ SOLN
INTRAMUSCULAR | Status: AC | PRN
  Administered 2019-05-08: 2 mg via INTRAVENOUS
  Administered 2019-05-08 (×2): 1 mg via INTRAVENOUS

## 2019-05-08 MED ORDER — HYDRALAZINE HCL 20 MG/ML IJ SOLN
INTRAMUSCULAR | Status: AC
  Filled 2019-05-08: qty 1

## 2019-05-08 NOTE — Progress Notes (Signed)
Discharge instructions reviewed with patient and family. Verbalized understanding. 

## 2019-05-08 NOTE — Discharge Instructions (Addendum)
Percutaneous Kidney Biopsy, Care After This sheet gives you information about how to care for yourself after your procedure. Your health care provider may also give you more specific instructions. If you have problems or questions, contact your health care provider. What can I expect after the procedure? After the procedure, it is common to have:  Pain or soreness near the biopsy site.  Pink or cloudy urine for 24 hours after the procedure. Follow these instructions at home: Activity  Return to your normal activities as told by your health care provider. Ask your health care provider what activities are safe for you.  If you were given a sedative during the procedure, it can affect you for several hours. Do not drive or operate machinery until your health care provider says that it is safe.  Do not lift anything that is heavier than 10 lb (4.5 kg), or the limit that you are told, until your health care provider says that it is safe.  Avoid activities that take a lot of effort (are strenuous) until your health care provider approves. Most people will have to wait 2 weeks before returning to activities such as exercise or sex. General instructions   Take over-the-counter and prescription medicines only as told by your health care provider.  You may eat and drink after your procedure. Follow instructions from your health care provider about eating or drinking restrictions.  Check your biopsy site every day for signs of infection. Check for: ? More redness, swelling, or pain. ? Fluid or blood. ? Warmth. ? Pus or a bad smell.  Keep all follow-up visits as told by your health care provider. This is important. Contact a health care provider if:  You have more redness, swelling, or pain around your biopsy site.  You have fluid or blood coming from your biopsy site.  Your biopsy site feels warm to the touch.  You have pus or a bad smell coming from your biopsy site.  You have blood  in your urine more than 24 hours after your procedure.  You have a fever. Get help right away if:  Your urine is dark red or brown.  You cannot urinate.  It burns when you urinate.  You feel dizzy or light-headed.  You have severe pain in your abdomen or side. Summary  After the procedure, it is common to have pain or soreness at the biopsy site and pink or cloudy urine for the first 24 hours.  Check your biopsy site each day for signs of infection, such as more redness, swelling, or pain; fluid, blood, pus or a bad smell coming from the biopsy site; or the biopsy site feeling warm to touch.  Return to your normal activities as told by your health care provider. This information is not intended to replace advice given to you by your health care provider. Make sure you discuss any questions you have with your health care provider. Document Revised: 12/04/2018 Document Reviewed: 12/04/2018 Elsevier Patient Education  2020 Elsevier Inc. Moderate Conscious Sedation, Adult Sedation is the use of medicines to promote relaxation and relieve discomfort and anxiety. Moderate conscious sedation is a type of sedation. Under moderate conscious sedation, you are less alert than normal, but you are still able to respond to instructions, touch, or both. Moderate conscious sedation is used during short medical and dental procedures. It is milder than deep sedation, which is a type of sedation under which you cannot be easily woken up. It is also milder than   general anesthesia, which is the use of medicines to make you unconscious. Moderate conscious sedation allows you to return to your regular activities sooner. Tell a health care provider about:  Any allergies you have.  All medicines you are taking, including vitamins, herbs, eye drops, creams, and over-the-counter medicines.  Use of steroids (by mouth or creams).  Any problems you or family members have had with sedatives and anesthetic  medicines.  Any blood disorders you have.  Any surgeries you have had.  Any medical conditions you have, such as sleep apnea.  Whether you are pregnant or may be pregnant.  Any use of cigarettes, alcohol, marijuana, or street drugs. What are the risks? Generally, this is a safe procedure. However, problems may occur, including:  Getting too much medicine (oversedation).  Nausea.  Allergic reaction to medicines.  Trouble breathing. If this happens, a breathing tube may be used to help with breathing. It will be removed when you are awake and breathing on your own.  Heart trouble.  Lung trouble. What happens before the procedure? Staying hydrated Follow instructions from your health care provider about hydration, which may include:  Up to 2 hours before the procedure - you may continue to drink clear liquids, such as water, clear fruit juice, black coffee, and plain tea. Eating and drinking restrictions Follow instructions from your health care provider about eating and drinking, which may include:  8 hours before the procedure - stop eating heavy meals or foods such as meat, fried foods, or fatty foods.  6 hours before the procedure - stop eating light meals or foods, such as toast or cereal.  6 hours before the procedure - stop drinking milk or drinks that contain milk.  2 hours before the procedure - stop drinking clear liquids. Medicine Ask your health care provider about:  Changing or stopping your regular medicines. This is especially important if you are taking diabetes medicines or blood thinners.  Taking medicines such as aspirin and ibuprofen. These medicines can thin your blood. Do not take these medicines before your procedure if your health care provider instructs you not to.  Tests and exams  You will have a physical exam.  You may have blood tests done to show: ? How well your kidneys and liver are working. ? How well your blood can clot. General  instructions  Plan to have someone take you home from the hospital or clinic.  If you will be going home right after the procedure, plan to have someone with you for 24 hours. What happens during the procedure?  An IV tube will be inserted into one of your veins.  Medicine to help you relax (sedative) will be given through the IV tube.  The medical or dental procedure will be performed. What happens after the procedure?  Your blood pressure, heart rate, breathing rate, and blood oxygen level will be monitored often until the medicines you were given have worn off.  Do not drive for 24 hours. This information is not intended to replace advice given to you by your health care provider. Make sure you discuss any questions you have with your health care provider. Document Revised: 03/15/2017 Document Reviewed: 07/23/2015 Elsevier Patient Education  2020 Elsevier Inc.  

## 2019-05-08 NOTE — H&P (Signed)
Chief Complaint: Patient was seen in consultation today for renal biopsy at the request of Maxie Barb  Referring Physician(s): Maxie Barb  Supervising Physician: Richarda Overlie  Patient Status: [facility] - Out-pt  History of Present Illness: Mallory Lin is a 49 y.o. female being worked up for proteinuria. She is referred for image guided random renal biopsy PMHx, meds, labs, imaging, allergies reviewed. Feels well, no recent fevers, chills, illness. Has been NPO today as directed.    Past Medical History:  Diagnosis Date  . Recurrent boils   . UTI (lower urinary tract infection)     Past Surgical History:  Procedure Laterality Date  . HYDRADENITIS EXCISION Bilateral 06/06/2012   Procedure: EXCISION HYDRADENITIS AXILLA;  Surgeon: Velora Heckler, MD;  Location: [facility];  Service: General;  Laterality: Bilateral;  . INCISE AND DRAIN ABCESS     for hidradenitis  . INCISION AND DRAINAGE ABSCESS ANAL    . teeth extracted    . TUBAL LIGATION      Allergies: Patient has no known allergies.  Medications: Prior to Admission medications   Medication Sig Start Date End Date Taking? Authorizing Provider  furosemide (LASIX) 20 MG tablet Take 1 tablet (20 mg total) by mouth daily. 02/27/19  Yes Allayne Stack, DO     Family History  Problem Relation Age of Onset  . Healthy Mother   . Healthy Father     Social History   Socioeconomic History  . Marital status: Single    Spouse name: Not on file  . Number of children: Not on file  . Years of education: Not on file  . Highest education level: Not on file  Occupational History  . Not on file  Tobacco Use  . Smoking status: Never Smoker  . Smokeless tobacco: Never Used  Substance and Sexual Activity  . Alcohol use: No  . Drug use: No  . Sexual activity: Never    Birth control/protection: Surgical  Other Topics Concern  . Not on file  Social History Narrative  . Not on file    Social Determinants of Health   Financial Resource Strain:   . Difficulty of Paying Living Expenses: Not on file  Food Insecurity:   . Worried About Programme researcher, broadcasting/film/video in the Last Year: Not on file  . Ran Out of Food in the Last Year: Not on file  Transportation Needs:   . Lack of Transportation (Medical): Not on file  . Lack of Transportation (Non-Medical): Not on file  Physical Activity:   . Days of Exercise per Week: Not on file  . Minutes of Exercise per Session: Not on file  Stress:   . Feeling of Stress : Not on file  Social Connections:   . Frequency of Communication with Friends and Family: Not on file  . Frequency of Social Gatherings with Friends and Family: Not on file  . Attends Religious Services: Not on file  . Active Member of Clubs or Organizations: Not on file  . Attends Banker Meetings: Not on file  . Marital Status: Not on file     Review of Systems: A 12 point ROS discussed and pertinent positives are indicated in the HPI above.  All other systems are negative.  Review of Systems  Vital Signs: BP (!) 164/90   Pulse 87   Temp 97.9 F (36.6 C) (Oral)   Resp 16   Ht 5\' 6"  (1.676 m)   Wt 77.1  kg   LMP 07/04/2011   SpO2 100%   BMI 27.44 kg/m   Physical Exam Constitutional:      Appearance: Normal appearance.  HENT:     Head: Normocephalic.     Mouth/Throat:     Mouth: Mucous membranes are moist.     Pharynx: Oropharynx is clear.  Cardiovascular:     Rate and Rhythm: Normal rate and regular rhythm.     Heart sounds: Normal heart sounds.  Pulmonary:     Effort: Pulmonary effort is normal. No respiratory distress.     Breath sounds: Normal breath sounds.  Abdominal:     General: Abdomen is flat. There is no distension.     Palpations: Abdomen is soft.     Tenderness: There is no abdominal tenderness.  Skin:    General: Skin is warm and dry.  Neurological:     General: No focal deficit present.     Mental Status: She is  alert and oriented to person, place, and time.  Psychiatric:        Mood and Affect: Mood normal.        Thought Content: Thought content normal.        Judgment: Judgment normal.     Imaging: No results found.  Labs:  CBC: Recent Labs    03/25/19 1548 05/08/19 0605  WBC 5.9 8.1  HGB 12.3 12.9  HCT 39.4 40.8  PLT 268 278    COAGS: No results for input(s): INR, APTT in the last 8760 hours.  BMP: Recent Labs    02/25/19 1116 03/04/19 0902 03/16/19 1031 03/25/19 1548  NA 142 142 143 141  K 3.9 3.6 3.8 4.1  CL 109 106 106 108  CO2 27 26 25 25   GLUCOSE 85 70 75 93  BUN 7 11 10 9   CALCIUM 8.4* 8.5* 8.5* 7.7*  CREATININE 0.81 0.76 0.75 0.85  GFRNONAA >60 93 95 >60  GFRAA >60 107 109 >60    LIVER FUNCTION TESTS: Recent Labs    03/04/19 0902 03/16/19 1031  BILITOT 0.4 0.3  AST 18 16  ALT 11 10  ALKPHOS 121* 106  PROT 5.2* 5.3*  ALBUMIN 2.4* 2.4*    TUMOR MARKERS: No results for input(s): AFPTM, CEA, CA199, CHROMGRNA in the last 8760 hours.  Assessment and Plan: Proteinuria For US guided random renal biopsy Labs ok Risks and benefits of renal bx was discussed with the patient and/or patient's family including, but not limited to bleeding, infection, damage to adjacent structures or low yield requiring additional tests.  All of the questions were answered and there is agreement to proceed.  Consent signed and in chart.    Thank you for this interesting consult.  I greatly enjoyed meeting KAMIRYN BEZANSON and look forward to participating in their care.  A copy of this report was sent to the requesting provider on this date.  Electronically Signed: Ascencion Dike, PA-C 05/08/2019, 7:19 AM   I spent a total of 20 minutes in face to face in clinical consultation, greater than 50% of which was counseling/coordinating care for renal bx

## 2019-05-08 NOTE — Procedures (Signed)
Interventional Radiology Procedure:   Indications: Proteinuria  Procedure: US guided left renal biopsy  Findings: 2 cores from left kidney lower pole   Complications: None     EBL: less than 10 ml  Plan: Strict bedrest 4 hours, then discharge to home   Mallory Lin R. Lowella Dandy, MD  Pager: [phone]

## 2019-05-21 DIAGNOSIS — R600 Localized edema: Secondary | ICD-10-CM | POA: Diagnosis not present

## 2019-05-21 DIAGNOSIS — N022 Recurrent and persistent hematuria with diffuse membranous glomerulonephritis: Secondary | ICD-10-CM | POA: Diagnosis not present

## 2019-05-21 DIAGNOSIS — N049 Nephrotic syndrome with unspecified morphologic changes: Secondary | ICD-10-CM | POA: Diagnosis not present

## 2019-05-26 LAB — SURGICAL PATHOLOGY

## 2019-05-27 ENCOUNTER — Encounter (HOSPITAL_COMMUNITY): Payer: Self-pay

## 2019-06-04 ENCOUNTER — Other Ambulatory Visit (HOSPITAL_COMMUNITY): Payer: Self-pay | Admitting: *Deleted

## 2019-06-05 ENCOUNTER — Other Ambulatory Visit: Payer: Self-pay

## 2019-06-05 ENCOUNTER — Encounter (HOSPITAL_COMMUNITY)
Admission: RE | Admit: 2019-06-05 | Discharge: 2019-06-05 | Disposition: A | Discharge status: Home / Self Care | Payer: Medicaid Other | Source: Ambulatory Visit | Attending: Nephrology | Admitting: Nephrology

## 2019-06-05 DIAGNOSIS — N022 Recurrent and persistent hematuria with diffuse membranous glomerulonephritis: Secondary | ICD-10-CM | POA: Diagnosis not present

## 2019-06-05 MED ORDER — METHYLPREDNISOLONE SODIUM SUCC 125 MG IJ SOLR
125.0000 mg | INTRAMUSCULAR | Status: DC
  Administered 2019-06-05: 125 mg via INTRAVENOUS

## 2019-06-05 MED ORDER — DIPHENHYDRAMINE HCL 50 MG/ML IJ SOLN
25.0000 mg | INTRAMUSCULAR | Status: DC
  Administered 2019-06-05: 25 mg via INTRAVENOUS

## 2019-06-05 MED ORDER — DIPHENHYDRAMINE HCL 50 MG/ML IJ SOLN
INTRAMUSCULAR | Status: AC
  Filled 2019-06-05: qty 1

## 2019-06-05 MED ORDER — METHYLPREDNISOLONE SODIUM SUCC 125 MG IJ SOLR
INTRAMUSCULAR | Status: AC
  Filled 2019-06-05: qty 2

## 2019-06-05 MED ORDER — ACETAMINOPHEN 325 MG PO TABS
ORAL_TABLET | ORAL | Status: AC
  Filled 2019-06-05: qty 2

## 2019-06-05 MED ORDER — ACETAMINOPHEN 325 MG PO TABS
650.0000 mg | ORAL_TABLET | ORAL | Status: DC
  Administered 2019-06-05: 650 mg via ORAL

## 2019-06-05 MED ORDER — SODIUM CHLORIDE 0.9 % IV SOLN
1000.0000 mg | INTRAVENOUS | Status: DC
  Administered 2019-06-05: 1000 mg via INTRAVENOUS
  Filled 2019-06-05: qty 100

## 2019-06-05 NOTE — Discharge Instructions (Signed)
Rituximab injection What is this medicine? RITUXIMAB (ri TUX i mab) is a monoclonal antibody. It is used to treat certain types of cancer like non-Hodgkin lymphoma and chronic lymphocytic leukemia. It is also used to treat rheumatoid arthritis, granulomatosis with polyangiitis (or Wegener's granulomatosis), microscopic polyangiitis, and pemphigus vulgaris. This medicine may be used for other purposes; ask your health care provider or pharmacist if you have questions. COMMON BRAND NAME(S): Rituxan, RUXIENCE What should I tell my health care provider before I take this medicine? They need to know if you have any of these conditions:  heart disease  infection (especially a virus infection such as hepatitis B, chickenpox, cold sores, or herpes)  immune system problems  irregular heartbeat  kidney disease  low blood counts, like low white cell, platelet, or red cell counts  lung or breathing disease, like asthma  recently received or scheduled to receive a vaccine  an unusual or allergic reaction to rituximab, other medicines, foods, dyes, or preservatives  pregnant or trying to get pregnant  breast-feeding How should I use this medicine? This medicine is for infusion into a vein. It is administered in a hospital or clinic by a specially trained health care professional. A special MedGuide will be given to you by the pharmacist with each prescription and refill. Be sure to read this information carefully each time. Talk to your pediatrician regarding the use of this medicine in children. This medicine is not approved for use in children. Overdosage: If you think you have taken too much of this medicine contact a poison control center or emergency room at once. NOTE: This medicine is only for you. Do not share this medicine with others. What if I miss a dose? It is important not to miss a dose. Call your doctor or health care professional if you are unable to keep an appointment. What  may interact with this medicine?  cisplatin  live virus vaccines This list may not describe all possible interactions. Give your health care provider a list of all the medicines, herbs, non-prescription drugs, or dietary supplements you use. Also tell them if you smoke, drink alcohol, or use illegal drugs. Some items may interact with your medicine. What should I watch for while using this medicine? Your condition will be monitored carefully while you are receiving this medicine. You may need blood work done while you are taking this medicine. This medicine can cause serious allergic reactions. To reduce your risk you may need to take medicine before treatment with this medicine. Take your medicine as directed. In some patients, this medicine may cause a serious brain infection that may cause death. If you have any problems seeing, thinking, speaking, walking, or standing, tell your healthcare professional right away. If you cannot reach your healthcare professional, urgently seek other source of medical care. Call your doctor or health care professional for advice if you get a fever, chills or sore throat, or other symptoms of a cold or flu. Do not treat yourself. This drug decreases your body's ability to fight infections. Try to avoid being around people who are sick. Do not become pregnant while taking this medicine or for at least 12 months after stopping it. Women should inform their doctor if they wish to become pregnant or think they might be pregnant. There is a potential for serious side effects to an unborn child. Talk to your health care professional or pharmacist for more information. Do not breast-feed an infant while taking this medicine or for at   least 6 months after stopping it. What side effects may I notice from receiving this medicine? Side effects that you should report to your doctor or health care professional as soon as possible:  allergic reactions like skin rash, itching or  hives; swelling of the face, lips, or tongue  breathing problems  chest pain  changes in vision  diarrhea  headache with fever, neck stiffness, sensitivity to light, nausea, or confusion  fast, irregular heartbeat  loss of memory  low blood counts - this medicine may decrease the number of white blood cells, red blood cells and platelets. You may be at increased risk for infections and bleeding.  mouth sores  problems with balance, talking, or walking  redness, blistering, peeling or loosening of the skin, including inside the mouth  signs of infection - fever or chills, cough, sore throat, pain or difficulty passing urine  signs and symptoms of kidney injury like trouble passing urine or change in the amount of urine  signs and symptoms of liver injury like dark yellow or brown urine; general ill feeling or flu-like symptoms; light-colored stools; loss of appetite; nausea; right upper belly pain; unusually weak or tired; yellowing of the eyes or skin  signs and symptoms of low blood pressure like dizziness; feeling faint or lightheaded, falls; unusually weak or tired  stomach pain  swelling of the ankles, feet, hands  unusual bleeding or bruising  vomiting Side effects that usually do not require medical attention (report to your doctor or health care professional if they continue or are bothersome):  headache  joint pain  muscle cramps or muscle pain  nausea  tiredness This list may not describe all possible side effects. Call your doctor for medical advice about side effects. You may report side effects to FDA at 1-800-FDA-1088. Where should I keep my medicine? This drug is given in a hospital or clinic and will not be stored at home. NOTE: This sheet is a summary. It may not cover all possible information. If you have questions about this medicine, talk to your doctor, pharmacist, or health care provider.  2020 Elsevier/Gold Standard (2018-05-14  22:01:36)  

## 2019-06-10 DIAGNOSIS — N022 Recurrent and persistent hematuria with diffuse membranous glomerulonephritis: Secondary | ICD-10-CM | POA: Diagnosis not present

## 2019-06-11 ENCOUNTER — Emergency Department (HOSPITAL_BASED_OUTPATIENT_CLINIC_OR_DEPARTMENT_OTHER): Payer: Medicaid Other

## 2019-06-11 ENCOUNTER — Emergency Department (HOSPITAL_COMMUNITY)
Admission: EM | Admit: 2019-06-11 | Discharge: 2019-06-11 | Disposition: A | Discharge status: Home / Self Care | Payer: Medicaid Other | Attending: Emergency Medicine | Admitting: Emergency Medicine

## 2019-06-11 ENCOUNTER — Encounter (HOSPITAL_COMMUNITY): Payer: Self-pay | Admitting: Emergency Medicine

## 2019-06-11 ENCOUNTER — Other Ambulatory Visit: Payer: Self-pay

## 2019-06-11 DIAGNOSIS — E8809 Other disorders of plasma-protein metabolism, not elsewhere classified: Secondary | ICD-10-CM | POA: Insufficient documentation

## 2019-06-11 DIAGNOSIS — R2243 Localized swelling, mass and lump, lower limb, bilateral: Secondary | ICD-10-CM | POA: Diagnosis present

## 2019-06-11 DIAGNOSIS — N049 Nephrotic syndrome with unspecified morphologic changes: Secondary | ICD-10-CM | POA: Insufficient documentation

## 2019-06-11 DIAGNOSIS — R609 Edema, unspecified: Secondary | ICD-10-CM

## 2019-06-11 DIAGNOSIS — Z79899 Other long term (current) drug therapy: Secondary | ICD-10-CM | POA: Diagnosis not present

## 2019-06-11 DIAGNOSIS — R600 Localized edema: Secondary | ICD-10-CM | POA: Diagnosis not present

## 2019-06-11 DIAGNOSIS — M7989 Other specified soft tissue disorders: Secondary | ICD-10-CM

## 2019-06-11 DIAGNOSIS — Z8616 Personal history of COVID-19: Secondary | ICD-10-CM | POA: Insufficient documentation

## 2019-06-11 LAB — BASIC METABOLIC PANEL
Anion gap: 6 (ref 5–15)
BUN: 11 mg/dL (ref 6–20)
CO2: 28 mmol/L (ref 22–32)
Calcium: 8 mg/dL — ABNORMAL LOW (ref 8.9–10.3)
Chloride: 108 mmol/L (ref 98–111)
Creatinine, Ser: 0.85 mg/dL (ref 0.44–1.00)
GFR calc Af Amer: >60 mL/min (ref >60)
GFR calc non Af Amer: >60 mL/min (ref >60)
Glucose, Bld: 96 mg/dL (ref 70–99)
Potassium: 3.5 mmol/L (ref 3.5–5.1)
Sodium: 142 mmol/L (ref 135–145)

## 2019-06-11 LAB — URINALYSIS, ROUTINE W REFLEX MICROSCOPIC
Bilirubin Urine: NEGATIVE
Glucose, UA: NEGATIVE mg/dL
Ketones, ur: NEGATIVE mg/dL
Leukocytes,Ua: NEGATIVE
Nitrite: NEGATIVE
Protein, ur: >=300 mg/dL — AB
Specific Gravity, Urine: 1.022 (ref 1.005–1.030)
pH: 6 (ref 5.0–8.0)

## 2019-06-11 LAB — HEPATIC FUNCTION PANEL
ALT: 18 U/L (ref 0–44)
AST: 19 U/L (ref 15–41)
Albumin: 1.5 g/dL — ABNORMAL LOW (ref 3.5–5.0)
Alkaline Phosphatase: 74 U/L (ref 38–126)
Bilirubin, Direct: <0.1 mg/dL (ref 0.0–0.2)
Total Bilirubin: 0.6 mg/dL (ref 0.3–1.2)
Total Protein: 5 g/dL — ABNORMAL LOW (ref 6.5–8.1)

## 2019-06-11 LAB — CBC
HCT: 38.9 % (ref 36.0–46.0)
Hemoglobin: 12.4 g/dL (ref 12.0–15.0)
MCH: 29.7 pg (ref 26.0–34.0)
MCHC: 31.9 g/dL (ref 30.0–36.0)
MCV: 93.3 fL (ref 80.0–100.0)
Platelets: 252 10*3/uL (ref 150–400)
RBC: 4.17 MIL/uL (ref 3.87–5.11)
RDW: 13.6 % (ref 11.5–15.5)
WBC: 5.5 10*3/uL (ref 4.0–10.5)
nRBC: 0 % (ref 0.0–0.2)

## 2019-06-11 MED ORDER — SODIUM CHLORIDE 0.9% FLUSH: 3.0000 mL | Freq: Once | INTRAVENOUS | Status: DC

## 2019-06-11 MED ORDER — FUROSEMIDE 20 MG PO TABS: 40.0000 mg | ORAL_TABLET | Freq: Two times a day (BID) | ORAL | 0 refills | Status: AC

## 2019-06-11 MED ORDER — ASPIRIN 81 MG PO CHEW: 81.0000 mg | CHEWABLE_TABLET | Freq: Every day | ORAL | 0 refills | Status: AC

## 2019-06-11 MED ORDER — POTASSIUM CHLORIDE ER 20 MEQ PO TBCR: 20.0000 meq | EXTENDED_RELEASE_TABLET | Freq: Every day | ORAL | 0 refills | Status: DC

## 2019-06-11 NOTE — ED Triage Notes (Signed)
Pt arrives to ED from her MD's office with complaints of worsening swelling in her legs over the last couple of days. Patient has hx of hidradenitis, edema, and nephrotic syndrome. Patient denies CP, SOB, fevers, and chills.

## 2019-06-11 NOTE — Progress Notes (Signed)
Bilateral lower extremity venous duplex exam completed.  Preliminary results can be found under CV proc under chart review.  06/11/2019 4:36 PM  Frankey Botting, K., RDMS, RVT

## 2019-06-11 NOTE — Discharge Instructions (Addendum)
Today your lasix was doubled from 20mg  once a day to 40mg  twice a day.   In addition you were started on potassium and a baby aspirin.  Your nephrologist's office should call you to set up a follow-up appointment. I also recommend that you consider using her compression stockings to help with the swelling. Your swelling appears to be due to your albumin, high blood protein, being low as a result of your nephrotic syndrome.  This protein in your bloodstream helps keep fluid in your veins and arteries.  When it is low it can cause the fluid to leak out of your arteries and veins into the skin and tissues around it.  This appears to be why your legs are swollen today.

## 2019-06-11 NOTE — ED Provider Notes (Signed)
[facility] EMERGENCY DEPARTMENT Provider Note   CSN: [id number] Arrival date & time: 06/11/19  1226     History Chief Complaint  Patient presents with  . Leg Swelling    ELDENA DEDE is a 49 y.o. female with a past medical history of nephrotic syndrome, hidradenitis, who follows with Dr. Ronalee Belts with [facility] who presents today for evaluation of swelling. She reports that she called Dr. Ronalee Belts as she has had worsening leg swelling since her rituximab infusion on Friday.  She states that originally her left leg had swelling below the knee, however over the past week her right leg and her bilateral thighs have become swollen also.  She states that her legs hurt when she walked causing her to call Dr. Ronalee Belts who suggested that she come in to be evaluated and for DVT study. She does not take any hormones.  She denies any prior history of DVT/VTE.  HPI     Past Medical History:  Diagnosis Date  . Recurrent boils   . UTI (lower urinary tract infection)     Patient Active Problem List   Diagnosis Date Noted  . COVID-19 virus infection 03/27/2019  . Bilateral leg edema 02/27/2019  . Overweight (BMI 25.0-29.9) 01/15/2013  . Hidradenitis suppurativa 11/24/2010  . BOILS, RECURRENT 01/12/2008    Past Surgical History:  Procedure Laterality Date  . HYDRADENITIS EXCISION Bilateral 06/06/2012   Procedure: EXCISION HYDRADENITIS AXILLA;  Surgeon: Velora Heckler, MD;  Location: [facility];  Service: General;  Laterality: Bilateral;  . INCISE AND DRAIN ABCESS     for hidradenitis  . INCISION AND DRAINAGE ABSCESS ANAL    . teeth extracted    . TUBAL LIGATION       OB History   No obstetric history on file.     Family History  Problem Relation Age of Onset  . Healthy Mother   . Healthy Father     Social History   Tobacco Use  . Smoking status: Never Smoker  . Smokeless tobacco: Never Used  Substance Use Topics  . Alcohol use:  No  . Drug use: No    Home Medications Prior to Admission medications   Medication Sig Start Date End Date Taking? Authorizing Provider  lisinopril (ZESTRIL) 40 MG tablet Take 40 mg by mouth daily. 05/21/19  Yes [provider]  losartan (COZAAR) 50 MG tablet Take 50 mg by mouth daily. 05/21/19  Yes [provider]  aspirin 81 MG chewable tablet Chew 1 tablet (81 mg total) by mouth daily for 14 days. 06/11/19 06/25/19  Cristina Gong, PA-C  furosemide (LASIX) 20 MG tablet Take 2 tablets (40 mg total) by mouth 2 (two) times daily for 14 days. 06/11/19 06/25/19  Cristina Gong, PA-C  potassium chloride 20 MEQ TBCR Take 20 mEq by mouth daily for 14 days. 06/11/19 06/25/19  Cristina Gong, PA-C    Allergies    Patient has no known allergies.  Review of Systems   Review of Systems  Constitutional: Negative for activity change and fever.  HENT: Negative for congestion.   Respiratory: Negative for cough, chest tightness and shortness of breath.   Cardiovascular: Positive for leg swelling. Negative for chest pain and palpitations.  Gastrointestinal: Negative for abdominal pain.  Genitourinary: Negative for dysuria.  Neurological: Negative for weakness.  All other systems reviewed and are negative.   Physical Exam Updated Vital Signs BP (!) 161/83 (BP Location: Right Arm)  Pulse 82   Temp 98.5 F (36.9 C) (Oral)   Resp 18   Ht 5\' 6"  (1.676 m)   Wt 84.4 kg   LMP 07/04/2011   SpO2 97%   BMI 30.02 kg/m   Physical Exam Vitals and nursing note reviewed.  Constitutional:      General: She is not in acute distress.    Appearance: She is well-developed. She is not diaphoretic.  HENT:     Head: Normocephalic and atraumatic.  Eyes:     General: No scleral icterus.       Right eye: No discharge.        Left eye: No discharge.     Conjunctiva/sclera: Conjunctivae normal.  Cardiovascular:     Rate and Rhythm: Normal rate and regular rhythm.     Pulses:  Normal pulses.  Pulmonary:     Effort: Pulmonary effort is normal. No respiratory distress.     Breath sounds: No stridor.  Abdominal:     General: There is no distension.     Tenderness: There is no abdominal tenderness. There is no guarding.  Musculoskeletal:        General: No deformity.     Cervical back: Normal range of motion.     Right lower leg: Edema present.     Left lower leg: Edema present.     Comments: Bilaterally there is palpable pitting edema up to mid thighs.  Pitting edema in the lower legs is 3+, and decreases as edema moves proximally.  Skin:    General: Skin is warm and dry.  Neurological:     General: No focal deficit present.     Mental Status: She is alert and oriented to person, place, and time.     Motor: No abnormal muscle tone.  Psychiatric:        Mood and Affect: Mood normal.        Behavior: Behavior normal.     ED Results / Procedures / Treatments   Labs (all labs ordered are listed, but only abnormal results are displayed) Labs Reviewed  BASIC METABOLIC PANEL - Abnormal; Notable for the following components:      Result Value   Calcium 8.0 (*)    All other components within normal limits  HEPATIC FUNCTION PANEL - Abnormal; Notable for the following components:   Total Protein 5.0 (*)    Albumin 1.5 (*)    All other components within normal limits  URINALYSIS, ROUTINE W REFLEX MICROSCOPIC - Abnormal; Notable for the following components:   APPearance HAZY (*)    Hgb urine dipstick SMALL (*)    Protein, ur >=300 (*)    Bacteria, UA RARE (*)    All other components within normal limits  CBC  CBG MONITORING, ED    EKG None  Radiology VAS US LOWER EXTREMITY VENOUS (DVT) ([facility] and [facility] 7a-7p)  Result Date: 06/11/2019  Lower Venous DVTStudy Indications: Edema.  Limitations: Body habitus and poor ultrasound/tissue interface. Comparison Study: No prior exam. Performing Technologist: Kennedy BuckerKristy Siebrecht ARDMS, RVT  Examination Guidelines: A  complete evaluation includes B-mode imaging, spectral Doppler, color Doppler, and power Doppler as needed of all accessible portions of each vessel. Bilateral testing is considered an integral part of a complete examination. Limited examinations for reoccurring indications may be performed as noted. The reflux portion of the exam is performed with the patient in reverse Trendelenburg.  +---------+---------------+---------+-----------+----------+------------------+ RIGHT    CompressibilityPhasicitySpontaneityPropertiesThrombus Aging     +---------+---------------+---------+-----------+----------+------------------+ CFV  Full           Yes      Yes                                     +---------+---------------+---------+-----------+----------+------------------+ SFJ      Full                                                            +---------+---------------+---------+-----------+----------+------------------+ FV Prox  Full                                                            +---------+---------------+---------+-----------+----------+------------------+ FV Mid   Full                                                            +---------+---------------+---------+-----------+----------+------------------+ FV DistalFull                                                            +---------+---------------+---------+-----------+----------+------------------+ PFV      Full                                                            +---------+---------------+---------+-----------+----------+------------------+ POP      Full           Yes      Yes                                     +---------+---------------+---------+-----------+----------+------------------+ PTV      Full                                         visualized with                                                          color               +---------+---------------+---------+-----------+----------+------------------+ PERO     Full  visualized with                                                          color              +---------+---------------+---------+-----------+----------+------------------+   +---------+---------------+---------+-----------+----------+------------------+ LEFT     CompressibilityPhasicitySpontaneityPropertiesThrombus Aging     +---------+---------------+---------+-----------+----------+------------------+ CFV      Full           Yes      Yes                                     +---------+---------------+---------+-----------+----------+------------------+ SFJ      Full                                                            +---------+---------------+---------+-----------+----------+------------------+ FV Prox  Full                                                            +---------+---------------+---------+-----------+----------+------------------+ FV Mid   Full                                                            +---------+---------------+---------+-----------+----------+------------------+ FV DistalFull                                                            +---------+---------------+---------+-----------+----------+------------------+ PFV      Full                                                            +---------+---------------+---------+-----------+----------+------------------+ POP      Full                                                            +---------+---------------+---------+-----------+----------+------------------+ PTV      Full                                         visualized with  color              +---------+---------------+---------+-----------+----------+------------------+ PERO     Full                                          visualized with                                                          color              +---------+---------------+---------+-----------+----------+------------------+     Summary: BILATERAL: - No evidence of deep vein thrombosis seen in the lower extremities, bilaterally.  RIGHT: - No cystic structure found in the popliteal fossa. - Poorly visualized calf veins due to patient body habitus and pitting edema.  LEFT: - No cystic structure found in the popliteal fossa. - Poorly visualized calf veins due to patient body habitus and pitting edema.  *See table(s) above for measurements and observations. Electronically signed by Sherald Hess MD on 06/11/2019 at 7:14:25 PM.    Final     Procedures Procedures (including critical care time)  Medications Ordered in ED Medications  sodium chloride flush (NS) 0.9 % injection 3 mL (has no administration in time range)    ED Course  I have reviewed the triage vital signs and the nursing notes.  Pertinent labs & imaging results that were available during my care of the patient were reviewed by me and considered in my medical decision making (see chart for details).  Clinical Course as of Jun 10 2045  Thu Jun 11, 2019  1327 RDW: 13.6 [MT]  1529 Attempted to see patient, she is using the restroom for a urine sample    [EH]  1640 I spoke with Dr. Ronalee Belts, patient's primary nephrologist about negative DVT study and albumin being low at 1.5.  He recommends starting her on Lasix 40 mg BID, and 81 mg of aspirin daily stating that his office will reach out to her for follow-up. Will also start on potassium 89mew/day given her k is 3.5 today.    [EH]    Clinical Course User Index [EH] Cristina Gong, PA-C [MT] Renaye Rakers Kermit Balo, MD   Results for STACY, SAILER (MRN [id number]) as of 06/11/2019 16:23  Ref. Range 08/02/2008 20:09 03/04/2019 09:02 03/16/2019 10:31 06/11/2019 15:06  Albumin Latest Ref Range:  3.5 - 5.0 g/dL 4.0 2.4 (L) 2.4 (L) 1.5 (L)   Her albumin has down trended since last lab.   MDM Rules/Calculators/A&P                     Patient presents today for evaluation of bilateral lower extremity pitting edema.  She was reportedly referred here by her nephrologist. On exam she has pitting edema to bilateral lower extremities with intact pulses. DVT study was obtained which was slightly limited secondary to edema however no evidence of DVT. Given that patient is recently diagnosed with nephrotic syndrome labs are obtained and reviewed.  Her albumin is significantly low at 1.5, most recent was January 14, 2019 when it was 2.4 representing a significant change.  In addition her total protein is low at 5.0.  UA with over 300 protein consistent with  her nephrotic reported nephrotic syndrome. Creatinine and GFR are both normal.  BMP unremarkable except for mild hypocalcemia which is being treated currently with p.o. medications. I spoke with Dr. Ronalee Belts patient's primary nephrologist about the negative DVT study as he had referred her over here today. We discussed her hypoalbuminemia at 1.5, edema and lab results today. Per his recommendation increased her Lasix from 20 mg once a day up to 40 mg twice a day, added an 81 mg of aspirin daily. In addition her potassium today is 3.5, and she does not currently take any p.o. potassium.  Given the significant increase in her Lasix will also start her on 66 M EQ's per day of potassium to help prevent hypokalemia.  Dr. Ronalee Belts reports that office will schedule follow-up with the patient.  I discussed with patient that I suspect her hypoalbuminemia is the primary cause for her edema.  In addition to medication changes we also discussed importance of elevation and compression stockings to help with the symptomatic aspect and pain of the edema.  Return precautions were discussed with patient who states their understanding.  At the time of discharge  patient denied any unaddressed complaints or concerns.  Patient is agreeable for discharge home.  Note: Portions of this report may have been transcribed using voice recognition software. Every effort was made to ensure accuracy; however, inadvertent computerized transcription errors may be present   Final Clinical Impression(s) / ED Diagnoses Final diagnoses:  Edema due to hypoalbuminemia  Nephrotic syndrome    Rx / DC Orders ED Discharge Orders         Ordered    furosemide (LASIX) 20 MG tablet  2 times daily     06/11/19 1719    aspirin 81 MG chewable tablet  Daily     06/11/19 1719    potassium chloride 20 MEQ TBCR  Daily     06/11/19 1719           Norman Clay 06/11/19 2052    Mancel Bale, MD 06/11/19 2112

## 2019-06-18 ENCOUNTER — Other Ambulatory Visit (HOSPITAL_COMMUNITY): Payer: Self-pay | Admitting: *Deleted

## 2019-06-18 DIAGNOSIS — N022 Recurrent and persistent hematuria with diffuse membranous glomerulonephritis: Secondary | ICD-10-CM | POA: Diagnosis not present

## 2019-06-18 DIAGNOSIS — R600 Localized edema: Secondary | ICD-10-CM | POA: Diagnosis not present

## 2019-06-18 DIAGNOSIS — N049 Nephrotic syndrome with unspecified morphologic changes: Secondary | ICD-10-CM | POA: Diagnosis not present

## 2019-06-19 ENCOUNTER — Ambulatory Visit (HOSPITAL_COMMUNITY)
Admission: RE | Admit: 2019-06-19 | Discharge: 2019-06-19 | Disposition: A | Discharge status: Home / Self Care | Payer: Medicaid Other | Source: Ambulatory Visit | Attending: Nephrology | Admitting: Nephrology

## 2019-06-19 ENCOUNTER — Other Ambulatory Visit: Payer: Self-pay

## 2019-06-19 DIAGNOSIS — N049 Nephrotic syndrome with unspecified morphologic changes: Secondary | ICD-10-CM | POA: Insufficient documentation

## 2019-06-19 LAB — CBC
HCT: 43.4 % (ref 36.0–46.0)
Hemoglobin: 13.5 g/dL (ref 12.0–15.0)
MCH: 29.5 pg (ref 26.0–34.0)
MCHC: 31.1 g/dL (ref 30.0–36.0)
MCV: 95 fL (ref 80.0–100.0)
Platelets: 229 10*3/uL (ref 150–400)
RBC: 4.57 MIL/uL (ref 3.87–5.11)
RDW: 14.3 % (ref 11.5–15.5)
WBC: 6.2 10*3/uL (ref 4.0–10.5)
nRBC: 0 % (ref 0.0–0.2)

## 2019-06-19 MED ORDER — DIPHENHYDRAMINE HCL 50 MG/ML IJ SOLN
INTRAMUSCULAR | Status: AC
  Filled 2019-06-19: qty 1

## 2019-06-19 MED ORDER — DIPHENHYDRAMINE HCL 50 MG/ML IJ SOLN
25.0000 mg | INTRAMUSCULAR | Status: DC
  Administered 2019-06-19: 25 mg via INTRAVENOUS

## 2019-06-19 MED ORDER — METHYLPREDNISOLONE SODIUM SUCC 125 MG IJ SOLR
125.0000 mg | INTRAMUSCULAR | Status: DC
  Administered 2019-06-19: 125 mg via INTRAVENOUS

## 2019-06-19 MED ORDER — ACETAMINOPHEN 325 MG PO TABS
ORAL_TABLET | ORAL | Status: AC
  Filled 2019-06-19: qty 2

## 2019-06-19 MED ORDER — ACETAMINOPHEN 325 MG PO TABS
650.0000 mg | ORAL_TABLET | ORAL | Status: DC
  Administered 2019-06-19: 650 mg via ORAL

## 2019-06-19 MED ORDER — SODIUM CHLORIDE 0.9 % IV SOLN
1000.0000 mg | INTRAVENOUS | Status: DC
  Administered 2019-06-19: 1000 mg via INTRAVENOUS
  Filled 2019-06-19: qty 100

## 2019-06-19 MED ORDER — METHYLPREDNISOLONE SODIUM SUCC 125 MG IJ SOLR
INTRAMUSCULAR | Status: AC
  Filled 2019-06-19: qty 2

## 2019-06-19 NOTE — Progress Notes (Signed)
Left message with Amber that lab called and stated BMP hemolyzed.

## 2019-07-03 DIAGNOSIS — N022 Recurrent and persistent hematuria with diffuse membranous glomerulonephritis: Secondary | ICD-10-CM | POA: Diagnosis not present

## 2019-07-03 DIAGNOSIS — N049 Nephrotic syndrome with unspecified morphologic changes: Secondary | ICD-10-CM | POA: Diagnosis not present

## 2019-07-03 DIAGNOSIS — E876 Hypokalemia: Secondary | ICD-10-CM | POA: Diagnosis not present

## 2019-07-03 DIAGNOSIS — I129 Hypertensive chronic kidney disease with stage 1 through stage 4 chronic kidney disease, or unspecified chronic kidney disease: Secondary | ICD-10-CM | POA: Diagnosis not present

## 2019-07-03 DIAGNOSIS — R600 Localized edema: Secondary | ICD-10-CM | POA: Diagnosis not present

## 2019-07-13 ENCOUNTER — Ambulatory Visit (HOSPITAL_COMMUNITY)
Admission: EM | Admit: 2019-07-13 | Discharge: 2019-07-13 | Disposition: A | Discharge status: Home / Self Care | Payer: Medicaid Other | Attending: Family Medicine | Admitting: Family Medicine

## 2019-07-13 ENCOUNTER — Encounter (HOSPITAL_COMMUNITY): Payer: Self-pay

## 2019-07-13 DIAGNOSIS — N898 Other specified noninflammatory disorders of vagina: Secondary | ICD-10-CM | POA: Diagnosis not present

## 2019-07-13 DIAGNOSIS — L739 Follicular disorder, unspecified: Secondary | ICD-10-CM | POA: Diagnosis not present

## 2019-07-13 MED ORDER — DOXYCYCLINE HYCLATE 100 MG PO CAPS: 100.0000 mg | ORAL_CAPSULE | Freq: Two times a day (BID) | ORAL | 0 refills | Status: DC

## 2019-07-13 NOTE — ED Triage Notes (Signed)
Patient reports an odorless white discharge along with two bumps along her bikini line. Denies burning, frequency, or urgency.

## 2019-07-13 NOTE — ED Provider Notes (Signed)
[facility]    CSN: [id number] Arrival date & time: 07/13/19  2595      History   Chief Complaint Chief Complaint  Patient presents with  . Mass    HPI Mallory Lin is a 49 y.o. female.   Patient reports that she has been having vaginal discharge for the last 3 days.  Reports that its green/brown in color.  Denies dysuria, pain with sex, odor.  Denies being told that her partner has an STD.  Requesting STD testing today.  Patient has history of BV.  Patient also complains of boils to her bikini area.  Reports that she has had this issue for years, that every time she shaves her bikini area gets worse.  Medical history significant for recurrent boils, hidradenitis suppurativa, hypertension per chart review.  Reports that she needs antibiotics for this frequently.  Denies any open wounds, drainage from the area.  Denies headache, cough, shortness of breath, chest tightness, chills, body aches, fever, rash, other symptoms.  The history is provided by the patient.    Past Medical History:  Diagnosis Date  . Recurrent boils   . UTI (lower urinary tract infection)     Patient Active Problem List   Diagnosis Date Noted  . COVID-19 virus infection 03/27/2019  . Bilateral leg edema 02/27/2019  . Overweight (BMI 25.0-29.9) 01/15/2013  . Hidradenitis suppurativa 11/24/2010  . BOILS, RECURRENT 01/12/2008    Past Surgical History:  Procedure Laterality Date  . HYDRADENITIS EXCISION Bilateral 06/06/2012   Procedure: EXCISION HYDRADENITIS AXILLA;  Surgeon: Velora Heckler, MD;  Location: [facility];  Service: General;  Laterality: Bilateral;  . INCISE AND DRAIN ABCESS     for hidradenitis  . INCISION AND DRAINAGE ABSCESS ANAL    . teeth extracted    . TUBAL LIGATION      OB History   No obstetric history on file.      Home Medications    Prior to Admission medications   Medication Sig Start Date End Date Taking? Authorizing Provider  doxycycline  (VIBRAMYCIN) 100 MG capsule Take 1 capsule (100 mg total) by mouth 2 (two) times daily. 07/13/19   Moshe Cipro, NP  furosemide (LASIX) 20 MG tablet Take 2 tablets (40 mg total) by mouth 2 (two) times daily for 14 days. 06/11/19 06/25/19  Cristina Gong, PA-C  lisinopril (ZESTRIL) 40 MG tablet Take 40 mg by mouth daily. 05/21/19   [provider]  losartan (COZAAR) 50 MG tablet Take 50 mg by mouth daily. 05/21/19   [provider]  potassium chloride 20 MEQ TBCR Take 20 mEq by mouth daily for 14 days. 06/11/19 06/25/19  Cristina Gong, PA-C    Family History Family History  Problem Relation Age of Onset  . Healthy Mother   . Healthy Father     Social History Social History   Tobacco Use  . Smoking status: Never Smoker  . Smokeless tobacco: Never Used  Substance Use Topics  . Alcohol use: No  . Drug use: No     Allergies   Patient has no known allergies.   Review of Systems Review of Systems  Constitutional: Negative.  Negative for chills, fatigue and fever.  HENT: Negative.  Negative for congestion, ear pain, postnasal drip, sinus pressure, sinus pain and sore throat.   Eyes: Negative.  Negative for pain and visual disturbance.  Respiratory: Negative.  Negative for cough and shortness of breath.   Cardiovascular: Negative.  Negative for chest pain and palpitations.  Gastrointestinal: Negative.  Negative for abdominal pain and vomiting.  Genitourinary: Positive for vaginal discharge. Negative for difficulty urinating, dyspareunia, dysuria, flank pain, frequency, genital sores, hematuria, pelvic pain and urgency.  Musculoskeletal: Negative.  Negative for arthralgias and back pain.  Skin: Positive for wound. Negative for color change and rash.       Boils to bikini area  Allergic/Immunologic: Negative.   Neurological: Negative.  Negative for seizures and syncope.  Hematological: Negative.   Psychiatric/Behavioral: Negative.   All other systems  reviewed and are negative.    Physical Exam Triage Vital Signs ED Triage Vitals  Enc Vitals Group     BP      Pulse      Resp      Temp      Temp src      SpO2      Weight      Height      Head Circumference      Peak Flow      Pain Score      Pain Loc      Pain Edu?      Excl. in [place]?    No data found.  Updated Vital Signs BP (!) 157/81 (BP Location: Right Arm)   Pulse 78   Temp 98.2 F (36.8 C) (Oral)   Resp 16   Wt 182 lb (82.6 kg)   LMP 07/04/2011   SpO2 99%   BMI 29.38 kg/m   Visual Acuity Right Eye Distance:   Left Eye Distance:   Bilateral Distance:    Right Eye Near:   Left Eye Near:    Bilateral Near:     Physical Exam Vitals and nursing note reviewed.  Constitutional:      General: She is not in acute distress.    Appearance: Normal appearance. She is well-developed and normal weight. She is not ill-appearing.  HENT:     Head: Normocephalic and atraumatic.     Mouth/Throat:     Mouth: Mucous membranes are moist.     Pharynx: Oropharynx is clear.  Eyes:     Extraocular Movements: Extraocular movements intact.     Conjunctiva/sclera: Conjunctivae normal.     Pupils: Pupils are equal, round, and reactive to light.  Cardiovascular:     Rate and Rhythm: Normal rate and regular rhythm.     Heart sounds: Normal heart sounds. No murmur.  Pulmonary:     Effort: Pulmonary effort is normal. No respiratory distress.     Breath sounds: Normal breath sounds. No stridor. No wheezing, rhonchi or rales.  Chest:     Chest wall: No tenderness.  Abdominal:     General: Bowel sounds are normal. There is no distension.     Palpations: Abdomen is soft. There is no mass.     Tenderness: There is no abdominal tenderness. There is no guarding or rebound.     Hernia: No hernia is present.  Genitourinary:    General: Normal vulva.     Vagina: Vaginal discharge present.     Comments: Greenish-yellow vaginal discharge.  No odor. Musculoskeletal:         General: Normal range of motion.     Cervical back: Normal range of motion and neck supple.  Skin:    General: Skin is warm and dry.     Capillary Refill: Capillary refill takes less than 2 seconds.     Findings: Lesion present.  Comments: Boils along inguinal area bikini line bilaterally.  There appear to be 4 in total.  Areas are warm, erythematous, tender to touch.  Neurological:     General: No focal deficit present.     Mental Status: She is alert and oriented to person, place, and time.  Psychiatric:        Mood and Affect: Mood normal.        Behavior: Behavior normal.        Thought Content: Thought content normal.      UC Treatments / Results  Labs (all labs ordered are listed, but only abnormal results are displayed) Labs Reviewed  CERVICOVAGINAL ANCILLARY ONLY    EKG   Radiology No results found.  Procedures Procedures (including critical care time)  Medications Ordered in UC Medications - No data to display  Initial Impression / Assessment and Plan / UC Course  I have reviewed the triage vital signs and the nursing notes.  Pertinent labs & imaging results that were available during my care of the patient were reviewed by me and considered in my medical decision making (see chart for details).     Vaginal discharge x3 days, greenish and yellow in color.  No odor.  Swab obtained for STDs, BV, yeast during visit today.  Will inform patient of results.  Instructed to abstain from sex until results are back and negative for treatment is completed.  Boils noted to bikini area, they are warm, erythematous, tender to touch.  Patient has significant history for recurrent boils.  Discussed with patient that shaving her bikini area can continue to irritate the skin and cause boils to continue to return.  Prescribe doxycycline 100 mg twice daily x7 days.  Discussed that this could also be treatment for chlamydia or gonorrhea as well.  Discussed that if her swab is  positive for BV or Trichomonas, we will send in metronidazole as well if it is needed.  Instructed patient to follow-up if not noticing any improvement with antibiotics over the next 48 hours.  Instructed to go to the ER for concerning symptoms such as trouble swallowing, trouble breathing, high fever, other concerning symptoms. Final Clinical Impressions(s) / UC Diagnoses   Final diagnoses:  Folliculitis  Vaginal discharge     Discharge Instructions     You were prescribed doxycycline twice a day for 7 days.    Your STD tests are pending.  If your test results are positive, we will call you.  You may need additional treatment and your partner(s) may also need treatment.         ED Prescriptions    Medication Sig Dispense Auth. Provider   doxycycline (VIBRAMYCIN) 100 MG capsule Take 1 capsule (100 mg total) by mouth 2 (two) times daily. 14 capsule Moshe Cipro, NP     PDMP not reviewed this encounter.   Moshe Cipro, NP 07/13/19 1040

## 2019-07-13 NOTE — Discharge Instructions (Addendum)
You were prescribed doxycycline twice a day for 7 days.    Your STD tests are pending.  If your test results are positive, we will call you.  You may need additional treatment and your partner(s) may also need treatment.

## 2019-07-14 ENCOUNTER — Telehealth (HOSPITAL_COMMUNITY): Payer: Self-pay

## 2019-07-14 LAB — CERVICOVAGINAL ANCILLARY ONLY
Bacterial vaginitis: NEGATIVE
Candida vaginitis: POSITIVE — AB
Chlamydia: NEGATIVE
Neisseria Gonorrhea: NEGATIVE
Trichomonas: NEGATIVE

## 2019-07-14 MED ORDER — FLUCONAZOLE 150 MG PO TABS: 150.0000 mg | ORAL_TABLET | Freq: Every day | ORAL | 0 refills | Status: AC

## 2019-08-03 DIAGNOSIS — N022 Recurrent and persistent hematuria with diffuse membranous glomerulonephritis: Secondary | ICD-10-CM | POA: Diagnosis not present

## 2019-08-03 DIAGNOSIS — E876 Hypokalemia: Secondary | ICD-10-CM | POA: Diagnosis not present

## 2019-08-03 DIAGNOSIS — R600 Localized edema: Secondary | ICD-10-CM | POA: Diagnosis not present

## 2019-08-03 DIAGNOSIS — I129 Hypertensive chronic kidney disease with stage 1 through stage 4 chronic kidney disease, or unspecified chronic kidney disease: Secondary | ICD-10-CM | POA: Diagnosis not present

## 2019-08-03 DIAGNOSIS — N049 Nephrotic syndrome with unspecified morphologic changes: Secondary | ICD-10-CM | POA: Diagnosis not present

## 2019-10-29 DIAGNOSIS — E876 Hypokalemia: Secondary | ICD-10-CM | POA: Diagnosis not present

## 2019-10-29 DIAGNOSIS — R600 Localized edema: Secondary | ICD-10-CM | POA: Diagnosis not present

## 2019-10-29 DIAGNOSIS — N049 Nephrotic syndrome with unspecified morphologic changes: Secondary | ICD-10-CM | POA: Diagnosis not present

## 2019-10-29 DIAGNOSIS — N022 Recurrent and persistent hematuria with diffuse membranous glomerulonephritis: Secondary | ICD-10-CM | POA: Diagnosis not present

## 2019-10-30 DIAGNOSIS — R809 Proteinuria, unspecified: Secondary | ICD-10-CM | POA: Diagnosis not present

## 2019-11-13 ENCOUNTER — Other Ambulatory Visit (HOSPITAL_COMMUNITY): Payer: Self-pay

## 2019-11-13 NOTE — Addendum Note (Signed)
Addended by: Rickey Primus A on: 11/13/2019 12:42 PM   Modules accepted: Orders

## 2019-11-16 ENCOUNTER — Other Ambulatory Visit: Payer: Self-pay

## 2019-11-16 ENCOUNTER — Encounter (HOSPITAL_COMMUNITY)
Admission: RE | Admit: 2019-11-16 | Discharge: 2019-11-16 | Disposition: A | Discharge status: Home / Self Care | Payer: Medicaid Other | Source: Ambulatory Visit | Attending: Nephrology | Admitting: Nephrology

## 2019-11-16 DIAGNOSIS — N022 Recurrent and persistent hematuria with diffuse membranous glomerulonephritis: Secondary | ICD-10-CM | POA: Diagnosis not present

## 2019-11-16 MED ORDER — METHYLPREDNISOLONE SODIUM SUCC 125 MG IJ SOLR
INTRAMUSCULAR | Status: AC
  Filled 2019-11-16: qty 2

## 2019-11-16 MED ORDER — ACETAMINOPHEN 325 MG PO TABS
650.0000 mg | ORAL_TABLET | Freq: Once | ORAL | Status: AC
  Administered 2019-11-16: 650 mg via ORAL

## 2019-11-16 MED ORDER — DIPHENHYDRAMINE HCL 50 MG/ML IJ SOLN
25.0000 mg | Freq: Once | INTRAMUSCULAR | Status: AC
  Administered 2019-11-16: 25 mg via INTRAVENOUS

## 2019-11-16 MED ORDER — ACETAMINOPHEN 325 MG PO TABS
ORAL_TABLET | ORAL | Status: AC
  Filled 2019-11-16: qty 2

## 2019-11-16 MED ORDER — SODIUM CHLORIDE 0.9 % IV SOLN
1000.0000 mg | Freq: Once | INTRAVENOUS | Status: AC
  Administered 2019-11-16: 1000 mg via INTRAVENOUS
  Filled 2019-11-16: qty 100

## 2019-11-16 MED ORDER — DIPHENHYDRAMINE HCL 50 MG/ML IJ SOLN
INTRAMUSCULAR | Status: AC
  Filled 2019-11-16: qty 1

## 2019-11-16 MED ORDER — METHYLPREDNISOLONE SODIUM SUCC 125 MG IJ SOLR
125.0000 mg | Freq: Once | INTRAMUSCULAR | Status: AC
  Administered 2019-11-16: 125 mg via INTRAVENOUS

## 2019-11-24 ENCOUNTER — Encounter (HOSPITAL_COMMUNITY): Payer: Medicaid Other

## 2020-02-03 DIAGNOSIS — N049 Nephrotic syndrome with unspecified morphologic changes: Secondary | ICD-10-CM | POA: Diagnosis not present

## 2020-02-03 DIAGNOSIS — N022 Recurrent and persistent hematuria with diffuse membranous glomerulonephritis: Secondary | ICD-10-CM | POA: Diagnosis not present

## 2020-02-03 DIAGNOSIS — I129 Hypertensive chronic kidney disease with stage 1 through stage 4 chronic kidney disease, or unspecified chronic kidney disease: Secondary | ICD-10-CM | POA: Diagnosis not present

## 2020-02-03 DIAGNOSIS — R600 Localized edema: Secondary | ICD-10-CM | POA: Diagnosis not present

## 2020-03-28 ENCOUNTER — Ambulatory Visit (HOSPITAL_COMMUNITY)
Admission: EM | Admit: 2020-03-28 | Discharge: 2020-03-28 | Disposition: A | Discharge status: Home / Self Care | Payer: Medicaid Other | Attending: Family Medicine | Admitting: Family Medicine

## 2020-03-28 ENCOUNTER — Encounter (HOSPITAL_COMMUNITY): Payer: Self-pay

## 2020-03-28 ENCOUNTER — Other Ambulatory Visit: Payer: Self-pay

## 2020-03-28 DIAGNOSIS — N760 Acute vaginitis: Secondary | ICD-10-CM | POA: Diagnosis not present

## 2020-03-28 LAB — POCT URINALYSIS DIPSTICK, ED / UC
Bilirubin Urine: NEGATIVE
Glucose, UA: NEGATIVE mg/dL
Nitrite: NEGATIVE
Protein, ur: >=300 mg/dL — AB
Specific Gravity, Urine: >=1.03 (ref 1.005–1.030)
Urobilinogen, UA: 1 mg/dL (ref 0.0–1.0)
pH: 6 (ref 5.0–8.0)

## 2020-03-28 NOTE — ED Provider Notes (Signed)
[facility]    CSN: [id number] Arrival date & time: 03/28/20  4944      History   Chief Complaint Chief Complaint  Patient presents with  . Vaginitis    HPI Mallory Lin is a 49 y.o. female.   Here today with about a week of vaginal discharge and itching. Denies pelvic pain, hematuria, flank pain, N/V/D, fever. Some mild dysuria at times per patient. Not trying anything OTC for sxs aside from monistat which hasn't helped. Wanting to be checked for STIs today. Is postmenopausal.      Past Medical History:  Diagnosis Date  . Recurrent boils   . UTI (lower urinary tract infection)     Patient Active Problem List   Diagnosis Date Noted  . COVID-19 virus infection 03/27/2019  . Bilateral leg edema 02/27/2019  . Overweight (BMI 25.0-29.9) 01/15/2013  . Hidradenitis suppurativa 11/24/2010  . BOILS, RECURRENT 01/12/2008    Past Surgical History:  Procedure Laterality Date  . HYDRADENITIS EXCISION Bilateral 06/06/2012   Procedure: EXCISION HYDRADENITIS AXILLA;  Surgeon: Velora Heckler, MD;  Location: [facility];  Service: General;  Laterality: Bilateral;  . INCISE AND DRAIN ABCESS     for hidradenitis  . INCISION AND DRAINAGE ABSCESS ANAL    . teeth extracted    . TUBAL LIGATION      OB History   No obstetric history on file.      Home Medications    Prior to Admission medications   Medication Sig Start Date End Date Taking? Authorizing Provider  atorvastatin (LIPITOR) 10 MG tablet Take 10 mg by mouth daily. 02/15/20   [provider]  doxycycline (VIBRAMYCIN) 100 MG capsule Take 1 capsule (100 mg total) by mouth 2 (two) times daily. 07/13/19   Moshe Cipro, NP  furosemide (LASIX) 20 MG tablet Take 2 tablets (40 mg total) by mouth 2 (two) times daily for 14 days. 06/11/19 06/25/19  Cristina Gong, PA-C  lisinopril (ZESTRIL) 40 MG tablet Take 40 mg by mouth daily. 05/21/19   [provider]  losartan (COZAAR)  100 MG tablet Take 100 mg by mouth daily. 12/30/19   [provider]  losartan (COZAAR) 50 MG tablet Take 50 mg by mouth daily. 05/21/19   [provider]  potassium chloride 20 MEQ TBCR Take 20 mEq by mouth daily for 14 days. 06/11/19 06/25/19  Cristina Gong, PA-C    Family History Family History  Problem Relation Age of Onset  . Healthy Mother   . Healthy Father     Social History Social History   Tobacco Use  . Smoking status: Never Smoker  . Smokeless tobacco: Never Used  Vaping Use  . Vaping Use: Never used  Substance Use Topics  . Alcohol use: No  . Drug use: No     Allergies   Patient has no known allergies.   Review of Systems Review of Systems PER HPI    Physical Exam Triage Vital Signs ED Triage Vitals  Enc Vitals Group     BP 03/28/20 1123 (!) 148/85     Pulse Rate 03/28/20 1123 69     Resp 03/28/20 1123 20     Temp 03/28/20 1123 97.9 F (36.6 C)     Temp Source 03/28/20 1123 Oral     SpO2 03/28/20 1123 99 %     Weight --      Height --      Head Circumference --  Peak Flow --      Pain Score 03/28/20 1124 0     Pain Loc --      Pain Edu? --      Excl. in [place]? --    No data found.  Updated Vital Signs BP (!) 148/85 (BP Location: Right Arm)   Pulse 69   Temp 97.9 F (36.6 C) (Oral)   Resp 20   LMP 07/04/2011   SpO2 99%   Visual Acuity Right Eye Distance:   Left Eye Distance:   Bilateral Distance:    Right Eye Near:   Left Eye Near:    Bilateral Near:     Physical Exam Vitals and nursing note reviewed.  Constitutional:      Appearance: Normal appearance. She is not ill-appearing.  HENT:     Head: Atraumatic.     Mouth/Throat:     Mouth: Mucous membranes are moist.     Pharynx: Oropharynx is clear.  Eyes:     Extraocular Movements: Extraocular movements intact.     Conjunctiva/sclera: Conjunctivae normal.  Cardiovascular:     Rate and Rhythm: Normal rate and regular rhythm.     Heart sounds:  Normal heart sounds.  Pulmonary:     Effort: Pulmonary effort is normal.     Breath sounds: Normal breath sounds.  Abdominal:     General: Bowel sounds are normal. There is no distension.     Palpations: Abdomen is soft.     Tenderness: There is no abdominal tenderness. There is no right CVA tenderness, left CVA tenderness or guarding.  Genitourinary:    Comments: GU exam deferred, self swab performed Musculoskeletal:        General: Normal range of motion.     Cervical back: Normal range of motion and neck supple.  Skin:    General: Skin is warm and dry.  Neurological:     Mental Status: She is alert and oriented to person, place, and time.  Psychiatric:        Mood and Affect: Mood normal.        Thought Content: Thought content normal.        Judgment: Judgment normal.     UC Treatments / Results  Labs (all labs ordered are listed, but only abnormal results are displayed) Labs Reviewed  POCT URINALYSIS DIPSTICK, ED / UC - Abnormal; Notable for the following components:      Result Value   Ketones, ur TRACE (*)    Hgb urine dipstick MODERATE (*)    Protein, ur >=300 (*)    Leukocytes,Ua SMALL (*)    All other components within normal limits  URINE CULTURE  CERVICOVAGINAL ANCILLARY ONLY    EKG   Radiology No results found.  Procedures Procedures (including critical care time)  Medications Ordered in UC Medications - No data to display  Initial Impression / Assessment and Plan / UC Course  I have reviewed the triage vital signs and the nursing notes.  Pertinent labs & imaging results that were available during my care of the patient were reviewed by me and considered in my medical decision making (see chart for details).     Aptima swab, urine culture pending. U/A with small amnt of leuks and hgb today. Increase fluids, probiotics, good vaginal hygiene. Avoid sexual contact until test results return. Treat based on results. Return precautions given.   Final  Clinical Impressions(s) / UC Diagnoses   Final diagnoses:  Acute vaginitis   Discharge Instructions  None    ED Prescriptions    None     PDMP not reviewed this encounter.   Particia Nearing, [place] 03/28/20 1237

## 2020-03-28 NOTE — ED Triage Notes (Signed)
Pt in with c/o yeast infection sxs that started last week. States she has been having discharge and itching  Pt took monistat with no relief

## 2020-03-29 LAB — URINE CULTURE: Culture: 20000 — AB

## 2020-03-30 LAB — CERVICOVAGINAL ANCILLARY ONLY
Bacterial Vaginitis (gardnerella): POSITIVE — AB
Candida Glabrata: NEGATIVE
Candida Vaginitis: NEGATIVE
Chlamydia: NEGATIVE
Comment: NEGATIVE
Comment: NEGATIVE
Comment: NEGATIVE
Comment: NEGATIVE
Comment: NEGATIVE
Comment: NORMAL
Neisseria Gonorrhea: NEGATIVE
Trichomonas: POSITIVE — AB

## 2020-03-31 ENCOUNTER — Telehealth (HOSPITAL_COMMUNITY): Payer: Self-pay | Admitting: Emergency Medicine

## 2020-03-31 DIAGNOSIS — E559 Vitamin D deficiency, unspecified: Secondary | ICD-10-CM | POA: Diagnosis not present

## 2020-03-31 DIAGNOSIS — N049 Nephrotic syndrome with unspecified morphologic changes: Secondary | ICD-10-CM | POA: Diagnosis not present

## 2020-03-31 DIAGNOSIS — E785 Hyperlipidemia, unspecified: Secondary | ICD-10-CM | POA: Diagnosis not present

## 2020-03-31 DIAGNOSIS — R600 Localized edema: Secondary | ICD-10-CM | POA: Diagnosis not present

## 2020-03-31 DIAGNOSIS — N022 Recurrent and persistent hematuria with diffuse membranous glomerulonephritis: Secondary | ICD-10-CM | POA: Diagnosis not present

## 2020-03-31 MED ORDER — METRONIDAZOLE 500 MG PO TABS: 500.0000 mg | ORAL_TABLET | Freq: Two times a day (BID) | ORAL | 0 refills | Status: DC

## 2020-04-04 DIAGNOSIS — N390 Urinary tract infection, site not specified: Secondary | ICD-10-CM | POA: Diagnosis not present

## 2020-05-06 ENCOUNTER — Encounter (HOSPITAL_COMMUNITY): Payer: Self-pay

## 2020-05-06 ENCOUNTER — Other Ambulatory Visit: Payer: Self-pay

## 2020-05-06 ENCOUNTER — Ambulatory Visit (HOSPITAL_COMMUNITY)
Admission: EM | Admit: 2020-05-06 | Discharge: 2020-05-06 | Disposition: A | Discharge status: Home / Self Care | Payer: Medicaid Other

## 2020-05-06 DIAGNOSIS — H5789 Other specified disorders of eye and adnexa: Secondary | ICD-10-CM

## 2020-05-06 NOTE — ED Triage Notes (Signed)
Pt presents with right eye redness, itching, and pain X 2 weeks.

## 2020-05-06 NOTE — Discharge Instructions (Addendum)
Because you have had this issue in the past and establish with an ophthalmologist, I would like you to call them Monday morning to be seen.  If symptoms worsen or you develop any vision loss, blurry vision or pain please be seen at urgent care or emergency department immediately.

## 2020-05-06 NOTE — ED Provider Notes (Signed)
[facility]    CSN: [id number] Arrival date & time: 05/06/20  1131      History   Chief Complaint Chief Complaint  Patient presents with   Eye Problem    HPI Mallory Lin is a 50 y.o. female.  2 week hx itching and redness to right eye Has tried clear eyes and visine with no relief, no injury or trauma No oozing or drainage, no blurred vision, sore, but no pain.  Has same issue in past seen by ophthalmologist.  States she was told it is some type of "rash".  Uncertain what drops they rx'd is not due to to follow-up until April.   Past Medical History:  Diagnosis Date   Recurrent boils    UTI (lower urinary tract infection)     Patient Active Problem List   Diagnosis Date Noted   COVID-19 virus infection 03/27/2019   Bilateral leg edema 02/27/2019   Overweight (BMI 25.0-29.9) 01/15/2013   Hidradenitis suppurativa 11/24/2010   BOILS, RECURRENT 01/12/2008    Past Surgical History:  Procedure Laterality Date   HYDRADENITIS EXCISION Bilateral 06/06/2012   Procedure: EXCISION HYDRADENITIS AXILLA;  Surgeon: Velora Heckler, MD;  Location: [facility];  Service: General;  Laterality: Bilateral;   INCISE AND DRAIN ABCESS     for hidradenitis   INCISION AND DRAINAGE ABSCESS ANAL     teeth extracted     TUBAL LIGATION      OB History   No obstetric history on file.      Home Medications    Prior to Admission medications   Medication Sig Start Date End Date Taking? Authorizing Provider  atorvastatin (LIPITOR) 10 MG tablet Take 10 mg by mouth daily. 02/15/20   [provider]  doxycycline (VIBRAMYCIN) 100 MG capsule Take 1 capsule (100 mg total) by mouth 2 (two) times daily. 07/13/19   Moshe Cipro, NP  furosemide (LASIX) 20 MG tablet Take 2 tablets (40 mg total) by mouth 2 (two) times daily for 14 days. 06/11/19 06/25/19  Cristina Gong, PA-C  lisinopril (ZESTRIL) 40 MG tablet Take 40 mg by mouth daily. 05/21/19    [provider]  losartan (COZAAR) 100 MG tablet Take 100 mg by mouth daily. 12/30/19   [provider]  losartan (COZAAR) 50 MG tablet Take 50 mg by mouth daily. 05/21/19   [provider]  metroNIDAZOLE (FLAGYL) 500 MG tablet Take 1 tablet (500 mg total) by mouth 2 (two) times daily. 03/31/20   Lamptey, Britta Mccreedy, MD  potassium chloride 20 MEQ TBCR Take 20 mEq by mouth daily for 14 days. 06/11/19 06/25/19  Cristina Gong, PA-C    Family History Family History  Problem Relation Age of Onset   Healthy Mother    Healthy Father     Social History Social History   Tobacco Use   Smoking status: Never Smoker   Smokeless tobacco: Never Used  Building services engineer Use: Never used  Substance Use Topics   Alcohol use: No   Drug use: No     Allergies   Patient has no known allergies.   Review of Systems As stated in HPI otherwise negative   Physical Exam Triage Vital Signs ED Triage Vitals  Enc Vitals Group     BP 05/06/20 1329 (!) 151/72     Pulse Rate 05/06/20 1329 68     Resp 05/06/20 1329 17     Temp 05/06/20 1329 98.2 F (36.8  C)     Temp Source 05/06/20 1329 Oral     SpO2 05/06/20 1329 100 %     Weight --      Height --      Head Circumference --      Peak Flow --      Pain Score 05/06/20 1327 4     Pain Loc --      Pain Edu? --      Excl. in [place]? --    No data found.  Updated Vital Signs BP (!) 151/72 (BP Location: Right Arm)    Pulse 68    Temp 98.2 F (36.8 C) (Oral)    Resp 17    LMP 07/04/2011    SpO2 100%   Visual Acuity Right Eye Distance:   Left Eye Distance:   Bilateral Distance:    Right Eye Near:   Left Eye Near:    Bilateral Near:     Physical Exam Constitutional:      General: She is not in acute distress.    Appearance: Normal appearance. She is not ill-appearing or toxic-appearing.  HENT:     Head: Normocephalic and atraumatic.     Nose: Nose normal.     Mouth/Throat:     Mouth: Mucous membranes  are moist.  Eyes:     Extraocular Movements: Extraocular movements intact.     Pupils: Pupils are equal, round, and reactive to light.     Comments: Lid and periorbital region normal.  Significant scleral injection on inner portion of right eye up to border of iris.  No foreign object noted, no drainage  Neurological:     Mental Status: She is alert.          UC Treatments / Results  Labs (all labs ordered are listed, but only abnormal results are displayed) Labs Reviewed - No data to display  EKG   Radiology No results found.  Procedures Procedures (including critical care time)  Medications Ordered in UC Medications - No data to display  Initial Impression / Assessment and Plan / UC Course  I have reviewed the triage vital signs and the nursing notes.  Pertinent labs & imaging results that were available during my care of the patient were reviewed by me and considered in my medical decision making (see chart for details).  Right eye irritation Uncertain etiology and patient with same symptoms in the past followed by ophthalmology.  States she was told it is some type of "rash".  Unfortunately ophthalmology office closed for the day.  Exam not consistent with bacterial or viral etiology, no evidence of foreign body.  We will hold treating for right now as patient is in no pain with no changes in vision.  She has been asked to call Dr. Laruth Bouchard office Monday morning to be seen.  Patient to return for any worsening or new symptoms.  Patient verifies understanding.  Reviewed expections re: course of current medical issues. Questions answered. Outlined signs and symptoms indicating need for more acute intervention. Pt verbalized understanding. AVS given  Final Clinical Impressions(s) / UC Diagnoses   Final diagnoses:  Eye irritation     Discharge Instructions     Because you have had this issue in the past and establish with an ophthalmologist, I would like you to  call them Monday morning to be seen.  If symptoms worsen or you develop any vision loss, blurry vision or pain please be seen at urgent care or emergency department immediately.  ED Prescriptions    None     PDMP not reviewed this encounter.   Rolla Etienne, NP 05/06/20 1515

## 2020-06-28 DIAGNOSIS — N022 Recurrent and persistent hematuria with diffuse membranous glomerulonephritis: Secondary | ICD-10-CM | POA: Diagnosis not present

## 2020-06-28 DIAGNOSIS — I129 Hypertensive chronic kidney disease with stage 1 through stage 4 chronic kidney disease, or unspecified chronic kidney disease: Secondary | ICD-10-CM | POA: Diagnosis not present

## 2020-06-28 DIAGNOSIS — E876 Hypokalemia: Secondary | ICD-10-CM | POA: Diagnosis not present

## 2020-06-28 DIAGNOSIS — R600 Localized edema: Secondary | ICD-10-CM | POA: Diagnosis not present

## 2020-06-28 DIAGNOSIS — E663 Overweight: Secondary | ICD-10-CM | POA: Diagnosis not present

## 2020-06-28 DIAGNOSIS — E559 Vitamin D deficiency, unspecified: Secondary | ICD-10-CM | POA: Diagnosis not present

## 2020-07-13 ENCOUNTER — Other Ambulatory Visit (HOSPITAL_COMMUNITY): Payer: Self-pay | Admitting: *Deleted

## 2020-07-14 ENCOUNTER — Other Ambulatory Visit: Payer: Self-pay

## 2020-07-14 ENCOUNTER — Ambulatory Visit (HOSPITAL_COMMUNITY)
Admission: RE | Admit: 2020-07-14 | Discharge: 2020-07-14 | Disposition: A | Discharge status: Home / Self Care | Payer: Medicaid Other | Source: Ambulatory Visit | Attending: Nephrology | Admitting: Nephrology

## 2020-07-14 DIAGNOSIS — N022 Recurrent and persistent hematuria with diffuse membranous glomerulonephritis: Secondary | ICD-10-CM | POA: Diagnosis not present

## 2020-07-14 MED ORDER — DIPHENHYDRAMINE HCL 50 MG/ML IJ SOLN: 25.0000 mg | Freq: Once | INTRAMUSCULAR | Status: AC

## 2020-07-14 MED ORDER — ACETAMINOPHEN 325 MG PO TABS
ORAL_TABLET | ORAL | Status: AC
  Administered 2020-07-14: 650 mg via ORAL
  Filled 2020-07-14: qty 2

## 2020-07-14 MED ORDER — DIPHENHYDRAMINE HCL 50 MG/ML IJ SOLN
INTRAMUSCULAR | Status: AC
  Administered 2020-07-14: 25 mg via INTRAVENOUS
  Filled 2020-07-14: qty 1

## 2020-07-14 MED ORDER — SODIUM CHLORIDE 0.9 % IV SOLN
1000.0000 mg | Freq: Once | INTRAVENOUS | Status: AC
  Administered 2020-07-14: 1000 mg via INTRAVENOUS
  Filled 2020-07-14: qty 100

## 2020-07-14 MED ORDER — METHYLPREDNISOLONE SODIUM SUCC 125 MG IJ SOLR: 125.0000 mg | Freq: Once | INTRAMUSCULAR | Status: AC

## 2020-07-14 MED ORDER — METHYLPREDNISOLONE SODIUM SUCC 125 MG IJ SOLR
INTRAMUSCULAR | Status: AC
  Administered 2020-07-14: 125 mg via INTRAVENOUS
  Filled 2020-07-14: qty 2

## 2020-07-14 MED ORDER — ACETAMINOPHEN 325 MG PO TABS: 650.0000 mg | ORAL_TABLET | Freq: Once | ORAL | Status: AC

## 2020-08-11 DIAGNOSIS — N022 Recurrent and persistent hematuria with diffuse membranous glomerulonephritis: Secondary | ICD-10-CM | POA: Diagnosis not present

## 2020-10-05 DIAGNOSIS — N022 Recurrent and persistent hematuria with diffuse membranous glomerulonephritis: Secondary | ICD-10-CM | POA: Diagnosis not present

## 2020-10-05 DIAGNOSIS — E559 Vitamin D deficiency, unspecified: Secondary | ICD-10-CM | POA: Diagnosis not present

## 2020-10-05 DIAGNOSIS — I129 Hypertensive chronic kidney disease with stage 1 through stage 4 chronic kidney disease, or unspecified chronic kidney disease: Secondary | ICD-10-CM | POA: Diagnosis not present

## 2020-10-05 DIAGNOSIS — R600 Localized edema: Secondary | ICD-10-CM | POA: Diagnosis not present

## 2020-12-08 ENCOUNTER — Emergency Department (HOSPITAL_COMMUNITY)
Admission: EM | Admit: 2020-12-08 | Discharge: 2020-12-08 | Disposition: A | Discharge status: Home / Self Care | Payer: Medicaid Other | Attending: Emergency Medicine | Admitting: Emergency Medicine

## 2020-12-08 ENCOUNTER — Other Ambulatory Visit: Payer: Self-pay

## 2020-12-08 ENCOUNTER — Encounter (HOSPITAL_COMMUNITY): Payer: Self-pay

## 2020-12-08 DIAGNOSIS — S39012A Strain of muscle, fascia and tendon of lower back, initial encounter: Secondary | ICD-10-CM | POA: Insufficient documentation

## 2020-12-08 DIAGNOSIS — X500XXA Overexertion from strenuous movement or load, initial encounter: Secondary | ICD-10-CM | POA: Insufficient documentation

## 2020-12-08 DIAGNOSIS — Z8616 Personal history of COVID-19: Secondary | ICD-10-CM | POA: Diagnosis not present

## 2020-12-08 DIAGNOSIS — S34109A Unspecified injury to unspecified level of lumbar spinal cord, initial encounter: Secondary | ICD-10-CM | POA: Diagnosis present

## 2020-12-08 MED ORDER — KETOROLAC TROMETHAMINE 60 MG/2ML IM SOLN
30.0000 mg | Freq: Once | INTRAMUSCULAR | Status: AC
  Administered 2020-12-08: 30 mg via INTRAMUSCULAR
  Filled 2020-12-08: qty 2

## 2020-12-08 MED ORDER — METHOCARBAMOL 500 MG PO TABS
500.0000 mg | ORAL_TABLET | Freq: Once | ORAL | Status: AC
  Administered 2020-12-08: 500 mg via ORAL
  Filled 2020-12-08: qty 1

## 2020-12-08 MED ORDER — METHOCARBAMOL 500 MG PO TABS: 500.0000 mg | ORAL_TABLET | Freq: Three times a day (TID) | ORAL | 0 refills | Status: AC | PRN

## 2020-12-08 NOTE — Discharge Instructions (Addendum)
Recommend taking anti-inflammatory, Tylenol and muscle relaxer as needed.  Recommend follow-up with your primary care doctor.  If you develop uncontrolled pain, numbness, weakness, bladder or bowel incontinence or other new concerning symptom, come back to ER for reassessment.

## 2020-12-08 NOTE — ED Triage Notes (Addendum)
Pt states she started having lower back pain that radiates down left leg two weeks ago after moving heavy bags. Pt states she has taken aleve and applied a cream without relief. Pain is keeping her from sleeping. Pt denies numbness, tingling, bowel/bladder incontinence.

## 2020-12-08 NOTE — ED Notes (Signed)
Discharge paperwork given to pt. Pt agreeable to discharge and understands instructions. Esignature pad not working. 

## 2020-12-08 NOTE — ED Provider Notes (Signed)
[facility] EMERGENCY DEPARTMENT Provider Note   CSN: [id number] Arrival date & time: 12/08/20  0400     History Chief Complaint  Patient presents with   Back Pain    Mallory Lin is a 50 y.o. female.  Presenting to the emergency room with concern for back pain.  Patient reports that couple weeks ago she was moving something particularly heavy bags when she felt a strain on her low back.  Has been having intermittent pain ever since.  Pain is worse with certain positions and with heavy lifting.  Radiates across her back.  Denies any falls.  No numbness weakness tingling.  No bladder or bowel incontinence.  No urinary retention.  No fevers or chills.  Otherwise has been doing well.  Tried taking some over-the-counter anti-inflammatories with some relief.  HPI     Past Medical History:  Diagnosis Date   Recurrent boils    UTI (lower urinary tract infection)     Patient Active Problem List   Diagnosis Date Noted   COVID-19 virus infection 03/27/2019   Bilateral leg edema 02/27/2019   Overweight (BMI 25.0-29.9) 01/15/2013   Hidradenitis suppurativa 11/24/2010   BOILS, RECURRENT 01/12/2008    Past Surgical History:  Procedure Laterality Date   HYDRADENITIS EXCISION Bilateral 06/06/2012   Procedure: EXCISION HYDRADENITIS AXILLA;  Surgeon: Velora Heckler, MD;  Location: [facility];  Service: General;  Laterality: Bilateral;   INCISE AND DRAIN ABCESS     for hidradenitis   INCISION AND DRAINAGE ABSCESS ANAL     teeth extracted     TUBAL LIGATION       OB History   No obstetric history on file.     Family History  Problem Relation Age of Onset   Healthy Mother    Healthy Father     Social History   Tobacco Use   Smoking status: Never   Smokeless tobacco: Never  Vaping Use   Vaping Use: Never used  Substance Use Topics   Alcohol use: No   Drug use: No    Home Medications Prior to Admission medications   Medication Sig  Start Date End Date Taking? Authorizing Provider  atorvastatin (LIPITOR) 10 MG tablet Take 10 mg by mouth daily. 02/15/20   [provider]  doxycycline (VIBRAMYCIN) 100 MG capsule Take 1 capsule (100 mg total) by mouth 2 (two) times daily. 07/13/19   Moshe Cipro, NP  furosemide (LASIX) 20 MG tablet Take 2 tablets (40 mg total) by mouth 2 (two) times daily for 14 days. 06/11/19 06/25/19  Cristina Gong, PA-C  lisinopril (ZESTRIL) 40 MG tablet Take 40 mg by mouth daily. 05/21/19   [provider]  losartan (COZAAR) 100 MG tablet Take 100 mg by mouth daily. 12/30/19   [provider]  losartan (COZAAR) 50 MG tablet Take 50 mg by mouth daily. 05/21/19   [provider]  metroNIDAZOLE (FLAGYL) 500 MG tablet Take 1 tablet (500 mg total) by mouth 2 (two) times daily. 03/31/20   Lamptey, Britta Mccreedy, MD  potassium chloride 20 MEQ TBCR Take 20 mEq by mouth daily for 14 days. 06/11/19 06/25/19  Cristina Gong, PA-C    Allergies    Patient has no known allergies.  Review of Systems   Review of Systems  Constitutional:  Negative for chills and fever.  HENT:  Negative for ear pain and sore throat.   Eyes:  Negative for pain and visual disturbance.  Respiratory:  Negative for cough and shortness of breath.   Cardiovascular:  Negative for chest pain and palpitations.  Gastrointestinal:  Negative for abdominal pain and vomiting.  Genitourinary:  Negative for dysuria and hematuria.  Musculoskeletal:  Positive for back pain. Negative for arthralgias.  Skin:  Negative for color change and rash.  Neurological:  Negative for seizures and syncope.  All other systems reviewed and are negative.  Physical Exam Updated Vital Signs BP (!) 156/79   Pulse 78   Temp 98 F (36.7 C) (Oral)   Resp 16   Ht 5\' 6"  (1.676 m)   Wt 80.7 kg   LMP 07/04/2011   SpO2 100%   BMI 28.73 kg/m   Physical Exam Vitals and nursing note reviewed.  Constitutional:      General: She  is not in acute distress.    Appearance: She is well-developed.  HENT:     Head: Normocephalic and atraumatic.  Eyes:     Conjunctiva/sclera: Conjunctivae normal.  Cardiovascular:     Rate and Rhythm: Normal rate and regular rhythm.     Heart sounds: No murmur heard. Pulmonary:     Effort: Pulmonary effort is normal. No respiratory distress.     Breath sounds: Normal breath sounds.     Comments: Some tenderness to palpation across her lumbar region, there is no step-off or deformity appreciated, no midline T or L-spine pain Abdominal:     Palpations: Abdomen is soft.     Tenderness: There is no abdominal tenderness.  Musculoskeletal:        General: No deformity or signs of injury.     Cervical back: Neck supple.  Skin:    General: Skin is warm and dry.  Neurological:     General: No focal deficit present.     Mental Status: She is alert.     Comments: Normal strength and sensation in lower extremities  Psychiatric:        Mood and Affect: Mood normal.    ED Results / Procedures / Treatments   Labs (all labs ordered are listed, but only abnormal results are displayed) Labs Reviewed - No data to display  EKG None  Radiology No results found.  Procedures Procedures   Medications Ordered in ED Medications  methocarbamol (ROBAXIN) tablet 500 mg (500 mg Oral Given 12/08/20 12/10/20)  ketorolac (TORADOL) injection 30 mg (30 mg Intramuscular Given 12/08/20 12/10/20)    ED Course  I have reviewed the triage vital signs and the nursing notes.  Pertinent labs & imaging results that were available during my care of the patient were reviewed by me and considered in my medical decision making (see chart for details).    MDM Rules/Calculators/A&P                           50 year old lady presents to ER with concern for low back pain.  Associated with heavy lifting a couple weeks ago.  On exam well-appearing in no distress.  Has some tenderness across her lumbar region, paraspinal  muscles but no focal T or L-spine tenderness.  Normal neuro exam.  Suspect MSK strain.  Recommend supportive care at present and follow-up with primary doctor.    After the discussed management above, the patient was determined to be safe for discharge.  The patient was in agreement with this plan and all questions regarding their care were answered.  ED return precautions were discussed and the patient will return to  the ED with any significant worsening of condition.   Final Clinical Impression(s) / ED Diagnoses Final diagnoses:  Low back strain, initial encounter    Rx / DC Orders ED Discharge Orders     None        Milagros Loll, MD 12/08/20 [phone]

## 2020-12-09 ENCOUNTER — Telehealth: Payer: Self-pay

## 2020-12-09 NOTE — Telephone Encounter (Signed)
Transition Care Management Unsuccessful Follow-up Telephone Call  Date of discharge and from where:  12/08/2020-[facility]   Attempts:  1st Attempt  Reason for unsuccessful TCM follow-up call:  Unable to leave message

## 2020-12-11 NOTE — Telephone Encounter (Signed)
Transition Care Management Follow-up Telephone Call Date of discharge and from where: 12/08/2020 from [facility] How have you been since you were released from the hospital? Pt stated that she is feeling much better.  Any questions or concerns? No  Items Reviewed: Did the pt receive and understand the discharge instructions provided? Yes  Medications obtained and verified? Yes  Other? No  Any new allergies since your discharge? No  Dietary orders reviewed? No Do you have support at home? Yes   Functional Questionnaire: (I = Independent and D = Dependent) ADLs: I  Bathing/Dressing- I  Meal Prep- I  Eating- I  Maintaining continence- I  Transferring/Ambulation- I  Managing Meds- I   Follow up appointments reviewed:  PCP Hospital f/u appt confirmed? No  Specialist Hospital f/u appt confirmed? No   Are transportation arrangements needed? No  If their condition worsens, is the pt aware to call PCP or go to the Emergency Dept.? Yes Was the patient provided with contact information for the PCP's office or ED? Yes Was to pt encouraged to call back with questions or concerns? Yes

## 2020-12-22 ENCOUNTER — Other Ambulatory Visit: Payer: Self-pay

## 2020-12-22 ENCOUNTER — Ambulatory Visit: Payer: Medicaid Other | Admitting: Family Medicine

## 2020-12-22 VITALS — BP 122/76 | HR 66 | Ht 66.0 in | Wt 190.8 lb

## 2020-12-22 DIAGNOSIS — L732 Hidradenitis suppurativa: Secondary | ICD-10-CM

## 2020-12-22 MED ORDER — CLINDAMYCIN PHOSPHATE 1 % EX SWAB: 1.0000 [IU] | Freq: Two times a day (BID) | CUTANEOUS | 2 refills | Status: DC

## 2020-12-22 NOTE — Assessment & Plan Note (Signed)
Axillary and groin boils returned in the last several months despite having surgery years ago.  Worse with shaving or wearing underwear, so patient has stopped both.  She is quite frustrated that these have returned.  Is currently using Dove brand body wash and deodorant only. - Topical clindamycin twice daily for now - [facility] medicine dermatology clinic appointment scheduled for late October - Dermatology referral, likely need p.o. or IM anti-inflammatory treatments unavailable at [facility]

## 2020-12-22 NOTE — Patient Instructions (Signed)
It was wonderful to meet you today. Thank you for allowing me to be a part of your care. Below is a short summary of what we discussed at your visit today:  Hidradenitis suppurativa - Use the antibiotic swabs twice daily every day and the areas that are prone to abscess formation - Follow-up with your dermatology appointment in late October for assessment of how you are responding to this.  Appointment details below. - Give [place] a phone call you have any questions or concerns    Please bring all of your medications to every appointment!  If you have any questions or concerns, please do not hesitate to contact us via phone or MyChart message.   Fayette Pho, MD

## 2020-12-22 NOTE — Progress Notes (Signed)
    SUBJECTIVE:   CHIEF COMPLAINT / HPI:   Hidradenitis suppurativa - had surgery a couple years ago, bilateral arms - has been getting boils back for the last couple of months - also has boils in groin area - no medicines or creams for it now or ever per patient - worsened with shaving, but still has boils  - worsened with wearing underwear - for bathing, uses dove brand body wash and deodorant  - patient is frustrated that these have returned  PERTINENT  PMH / PSH: Overweight, tenderness suprapubic, COVID-19 infection, UTI  OBJECTIVE:   BP 122/76   Pulse 66   Ht 5\' 6"  (1.676 m)   Wt 190 lb 12.8 oz (86.5 kg)   LMP 07/04/2011   SpO2 100%   BMI 30.80 kg/m   PHQ-9:  Depression screen [facility] 2/9 12/22/2020 02/27/2019 11/03/2018  Decreased Interest 0 0 0  Down, Depressed, Hopeless 0 0 0  PHQ - 2 Score 0 0 0  Altered sleeping 0 - -  Tired, decreased energy 0 - -  Change in appetite 0 - -  Feeling bad or failure about yourself  0 - -  Trouble concentrating 0 - -  Moving slowly or fidgety/restless 0 - -  Suicidal thoughts 0 - -  PHQ-9 Score 0 - -  Difficult doing work/chores Not difficult at all - -     GAD-7: No flowsheet data found.   Physical Exam General: Awake, alert, oriented, no acute distress Respiratory: Unlabored respirations, speaking in full sentences, no respiratory distress Extremities: Moving all extremities spontaneously Skin: bilateral axillary linear scars, two boils in left axilla with associated pain and swelling but no discharge or tunneling apparent   ASSESSMENT/PLAN:   Hidradenitis suppurativa Axillary and groin boils returned in the last several months despite having surgery years ago.  Worse with shaving or wearing underwear, so patient has stopped both.  She is quite frustrated that these have returned.  Is currently using Dove brand body wash and deodorant only. - Topical clindamycin twice daily for now - [facility] medicine dermatology clinic  appointment scheduled for late October - Dermatology referral, likely need p.o. or IM anti-inflammatory treatments unavailable at [facility]     [facility], MD [facility]

## 2021-01-06 DIAGNOSIS — N049 Nephrotic syndrome with unspecified morphologic changes: Secondary | ICD-10-CM | POA: Diagnosis not present

## 2021-01-06 DIAGNOSIS — I129 Hypertensive chronic kidney disease with stage 1 through stage 4 chronic kidney disease, or unspecified chronic kidney disease: Secondary | ICD-10-CM | POA: Diagnosis not present

## 2021-01-06 DIAGNOSIS — N181 Chronic kidney disease, stage 1: Secondary | ICD-10-CM | POA: Diagnosis not present

## 2021-01-06 DIAGNOSIS — N022 Recurrent and persistent hematuria with diffuse membranous glomerulonephritis: Secondary | ICD-10-CM | POA: Diagnosis not present

## 2021-01-06 DIAGNOSIS — E559 Vitamin D deficiency, unspecified: Secondary | ICD-10-CM | POA: Diagnosis not present

## 2021-01-06 DIAGNOSIS — E785 Hyperlipidemia, unspecified: Secondary | ICD-10-CM | POA: Diagnosis not present

## 2021-01-06 DIAGNOSIS — R600 Localized edema: Secondary | ICD-10-CM | POA: Diagnosis not present

## 2021-01-07 IMAGING — US US RENAL
1 series · 14 of 25 positions shown · non-contrast
Comparison: April 01, 2013.

CLINICAL DATA: Possible nephrotic syndrome. Bilateral lower
extremity edema.

EXAM:
RENAL / URINARY TRACT ULTRASOUND COMPLETE

[Series 1: us renal · 14 of 35 slices shown]
[im 1/35]
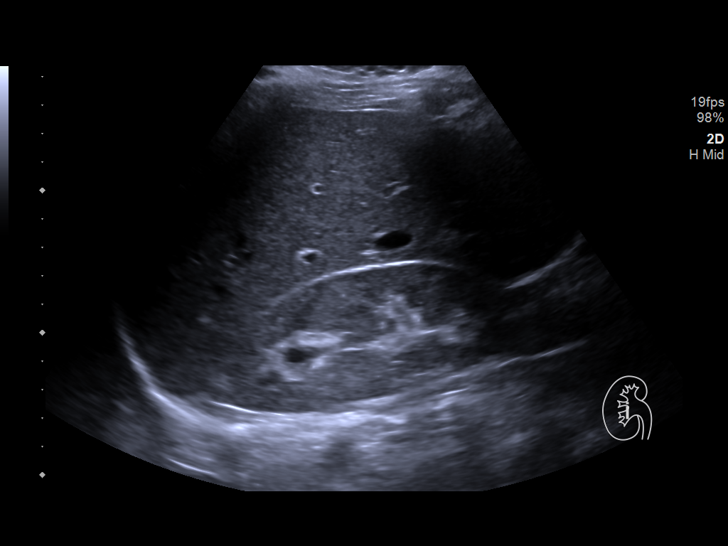
[im 3/35]
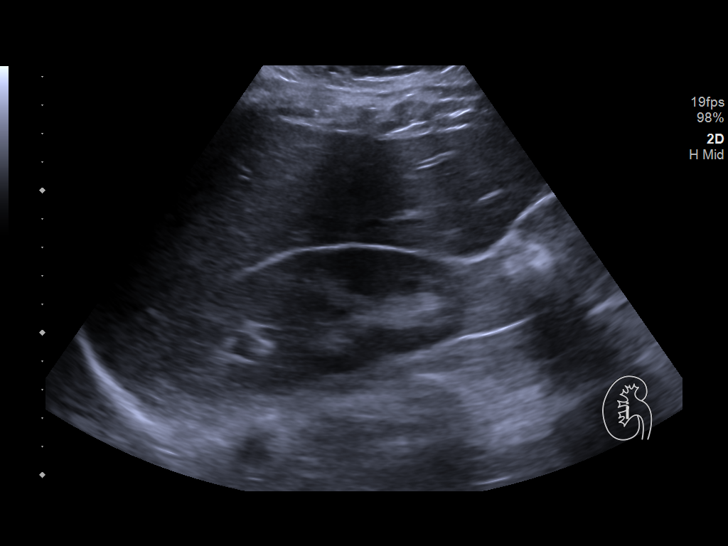
[im 6/35]
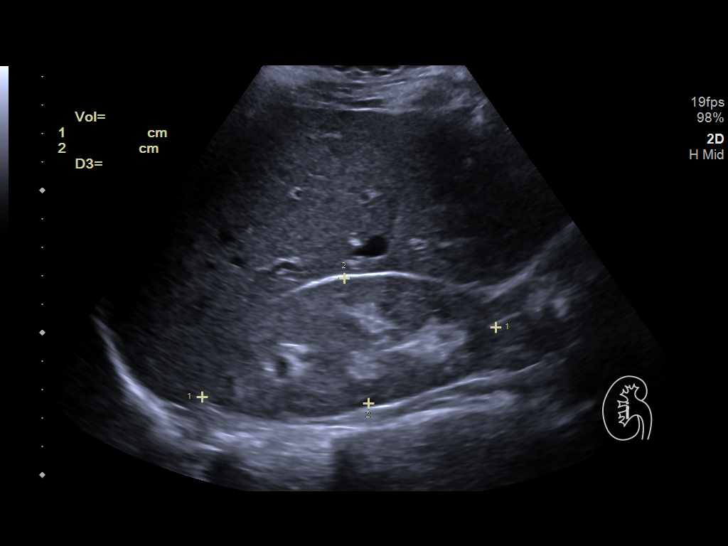
[im 9/35]
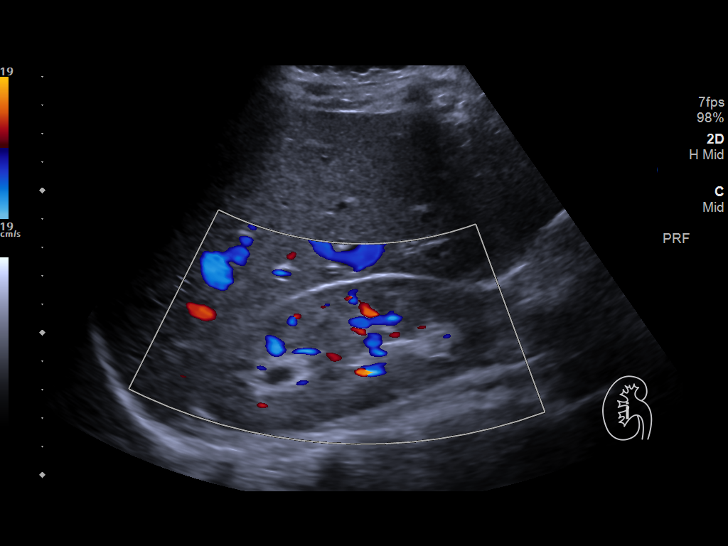
[im 12/35]
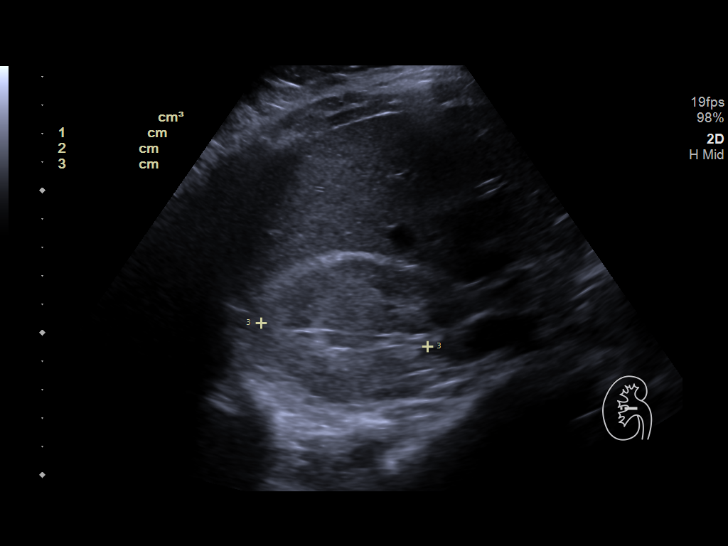
[im 13/35]
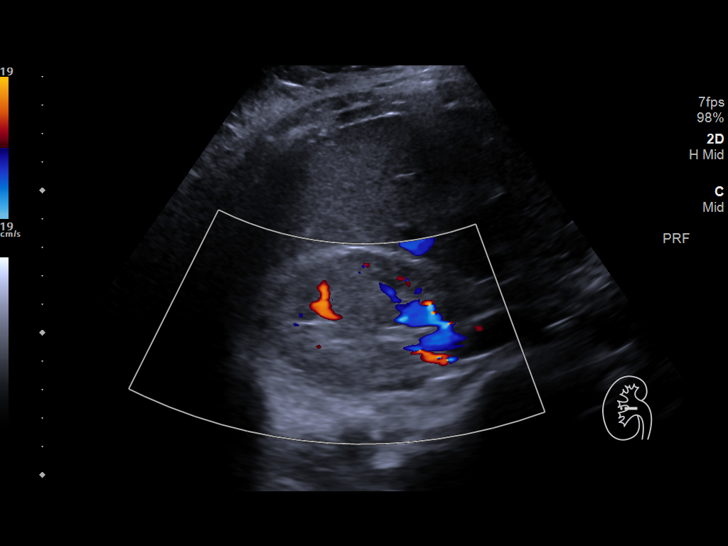
[im 16/35]
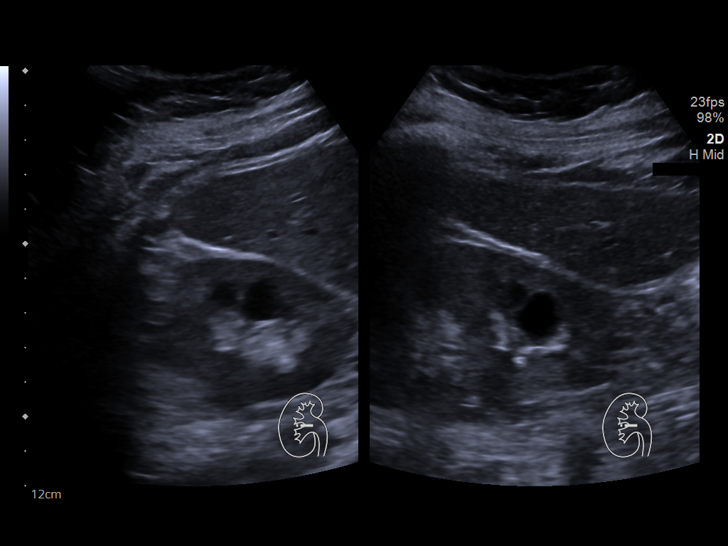
[im 19/35]
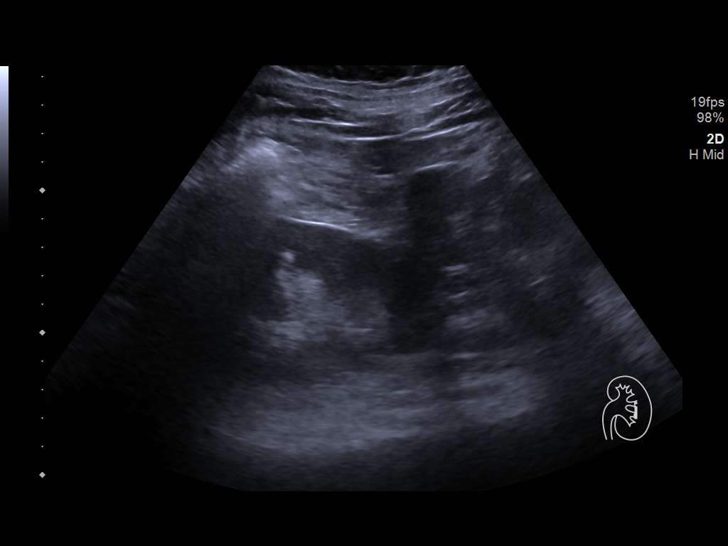
[im 22/35]
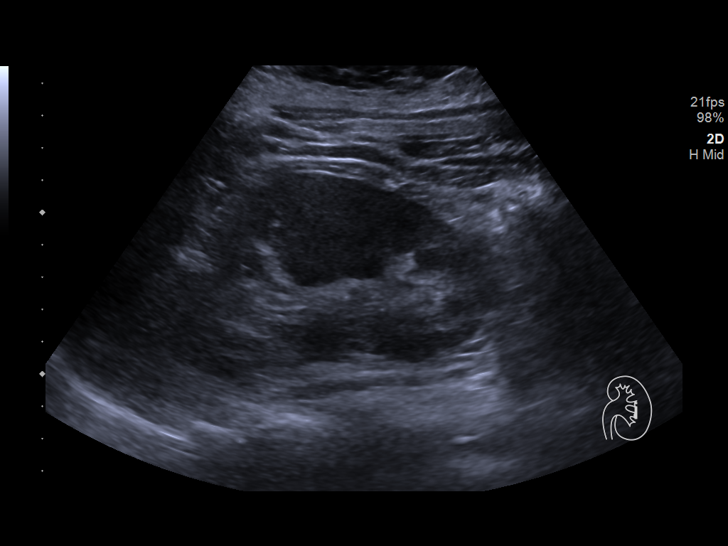
[im 23/35]
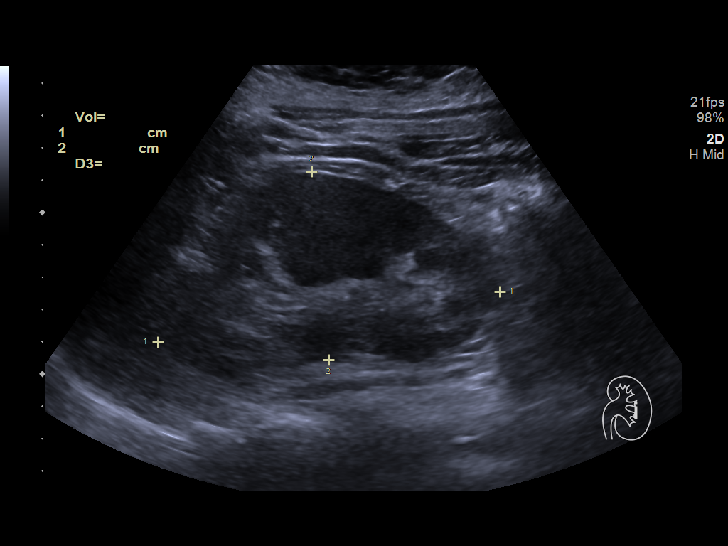
[im 26/35]
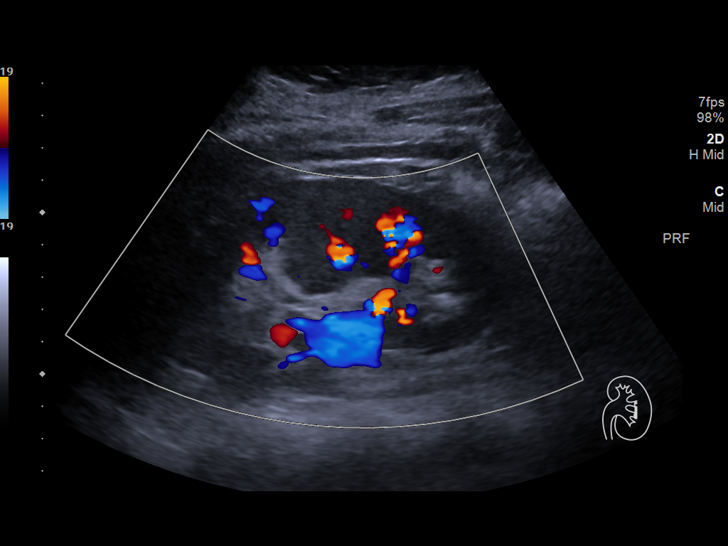
[im 29/35]
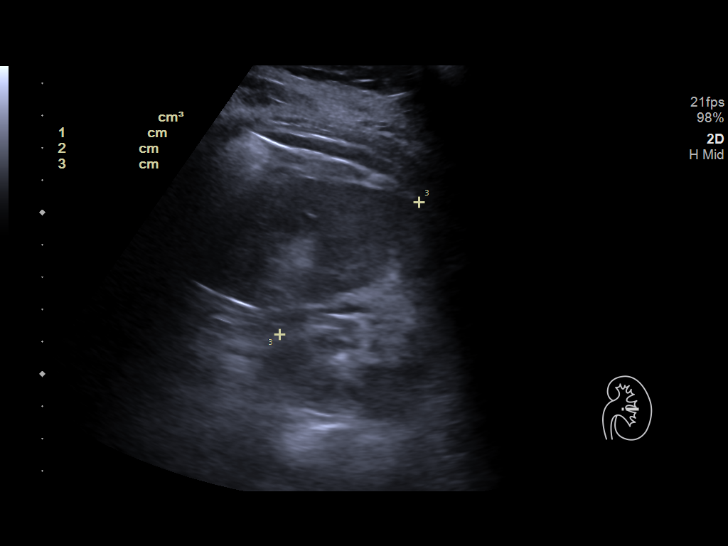
[im 32/35]
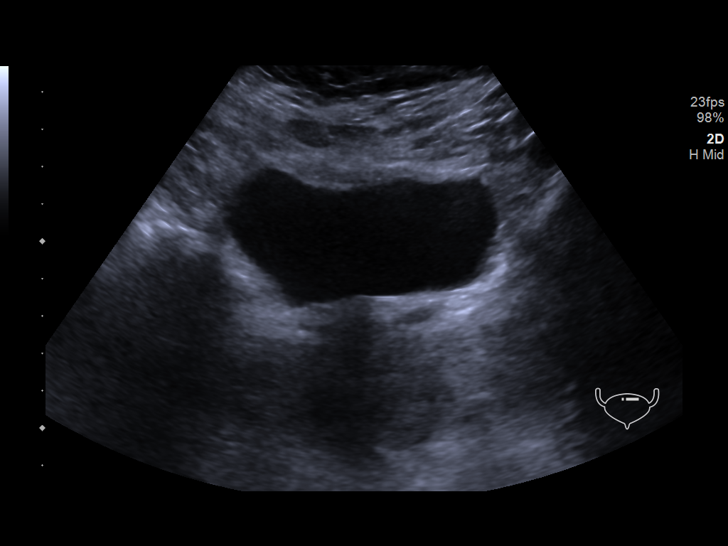
[im 35/35]
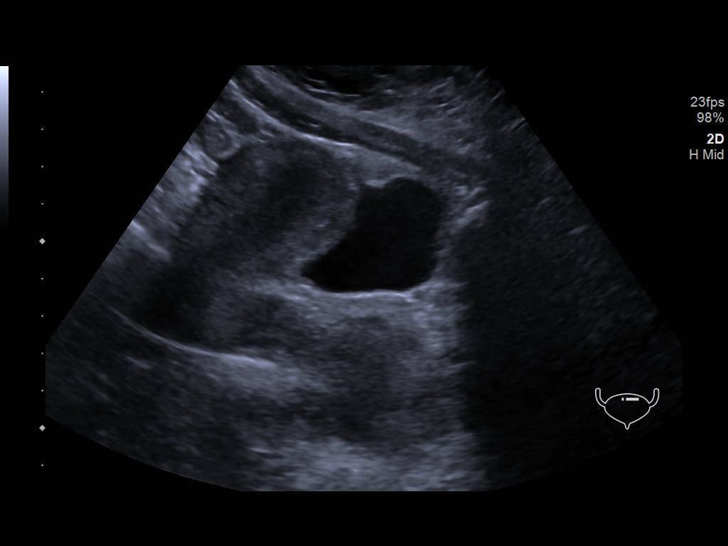

[14 of 25 positions shown; findings below may reference images not displayed]

FINDINGS: Right Kidney:

Renal measurements: 10.6 x 5.9 x 4.5 cm = volume: 147 mL. 1.6 cm
simple cyst is seen in lower pole. Echogenicity within normal
limits. No mass or hydronephrosis visualized.

Left Kidney:

Renal measurements: 10.7 x 6.0 x 5.9 cm = volume: 195 mL.
Echogenicity within normal limits. No mass or hydronephrosis
visualized.

Bladder:

Appears normal for degree of bladder distention.

Other:

None.
IMPRESSION: Small right renal cyst.  No other renal abnormality is noted.

## 2021-02-02 ENCOUNTER — Ambulatory Visit (INDEPENDENT_AMBULATORY_CARE_PROVIDER_SITE_OTHER): Payer: Medicaid Other | Admitting: Family Medicine

## 2021-02-02 DIAGNOSIS — Z91199 Patient's noncompliance with other medical treatment and regimen due to unspecified reason: Secondary | ICD-10-CM

## 2021-02-02 NOTE — Progress Notes (Signed)
No show for appointment.   Leolia Vinzant, MD  

## 2021-02-25 DIAGNOSIS — J039 Acute tonsillitis, unspecified: Secondary | ICD-10-CM | POA: Diagnosis not present

## 2021-02-26 IMAGING — US US BIOPSY
1 series · 6 of 6 positions shown · non-contrast
Comparison: none

INDICATION: 48-year-old with proteinuria.  Request for a renal biopsy.

[Series 1: us biopsy · 6 of 6 slices shown]
[im 1/6]
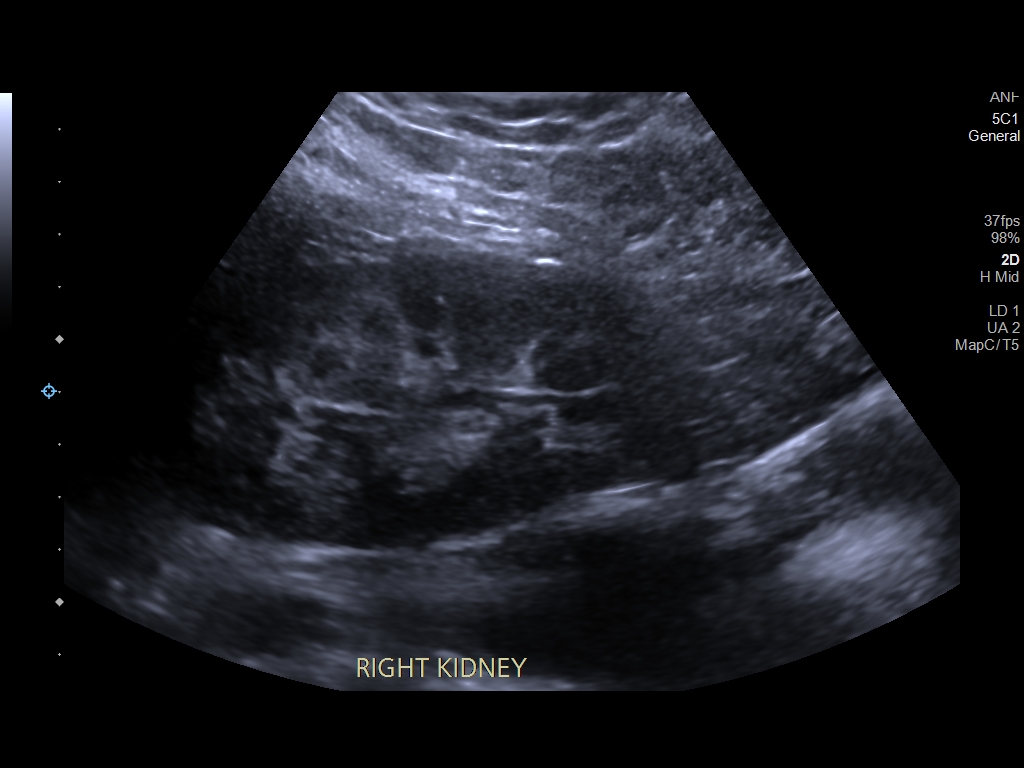
[im 2/6]
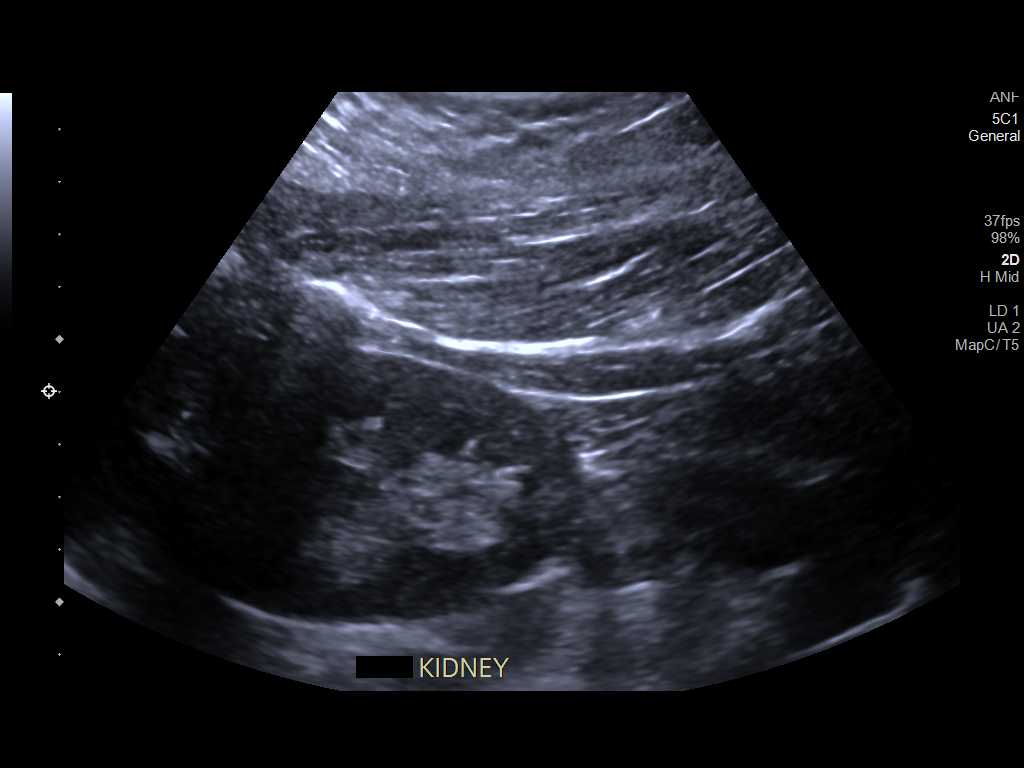
[im 3/6]
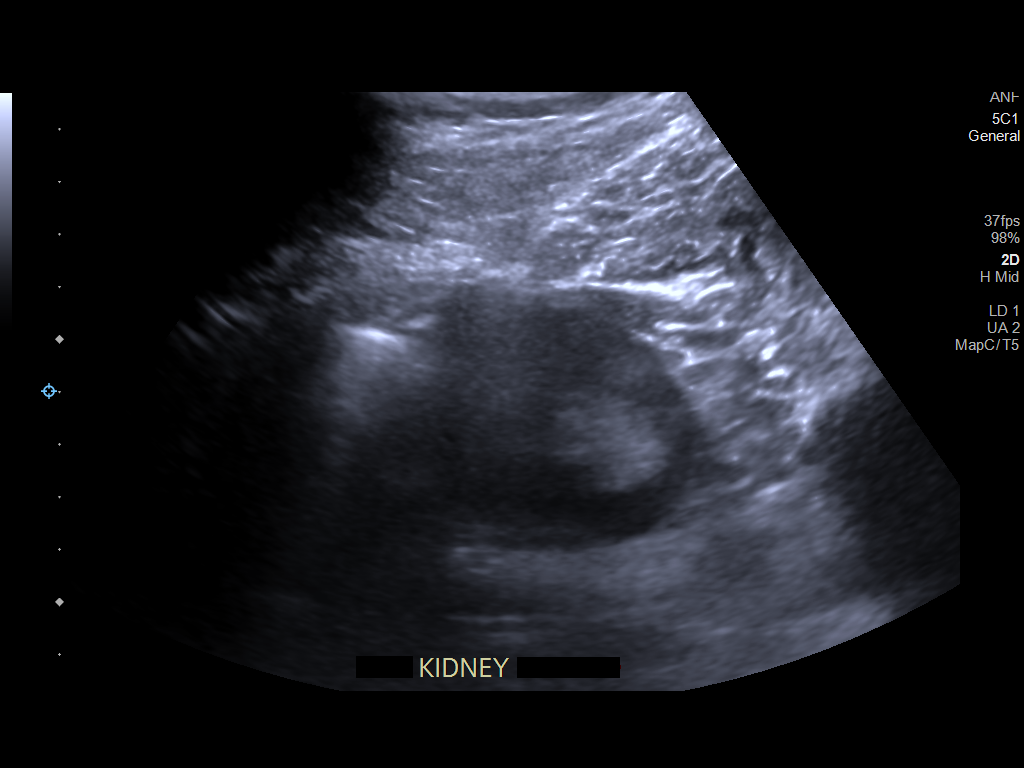
[im 4/6]
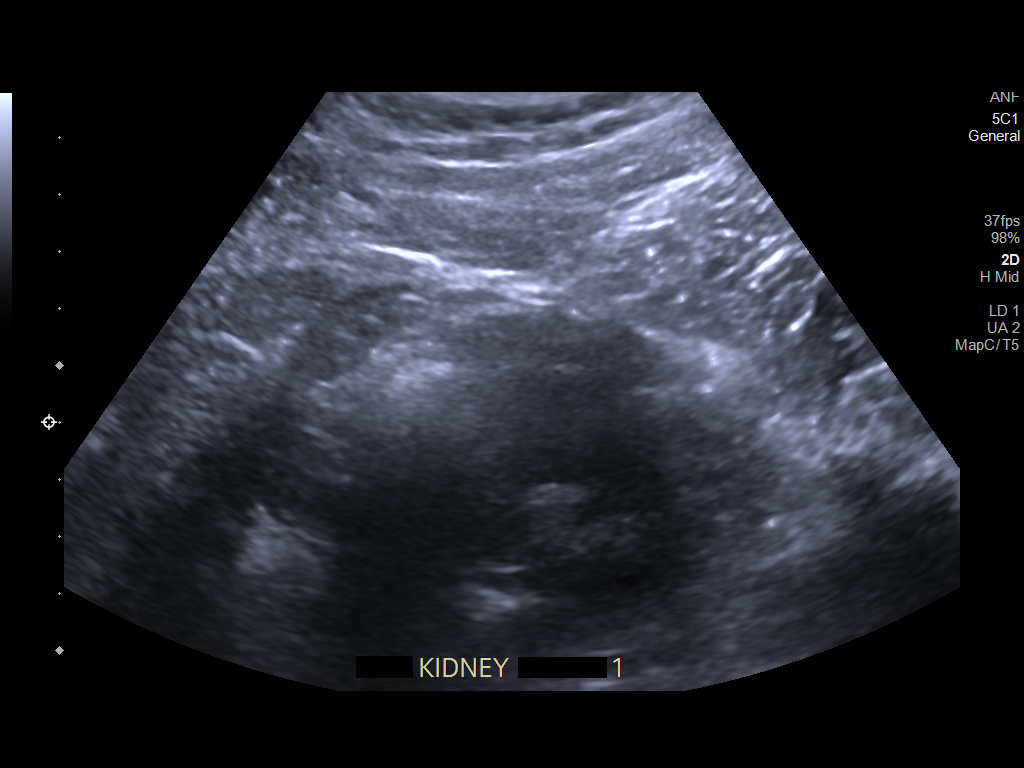
[im 5/6]
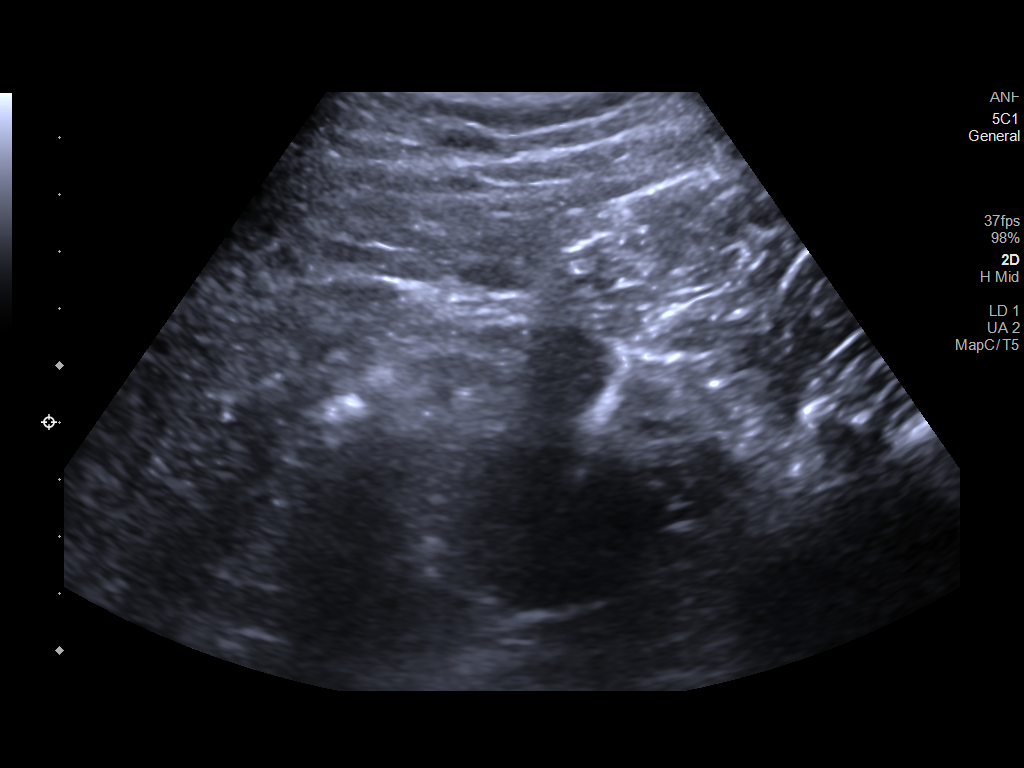
[im 6/6]
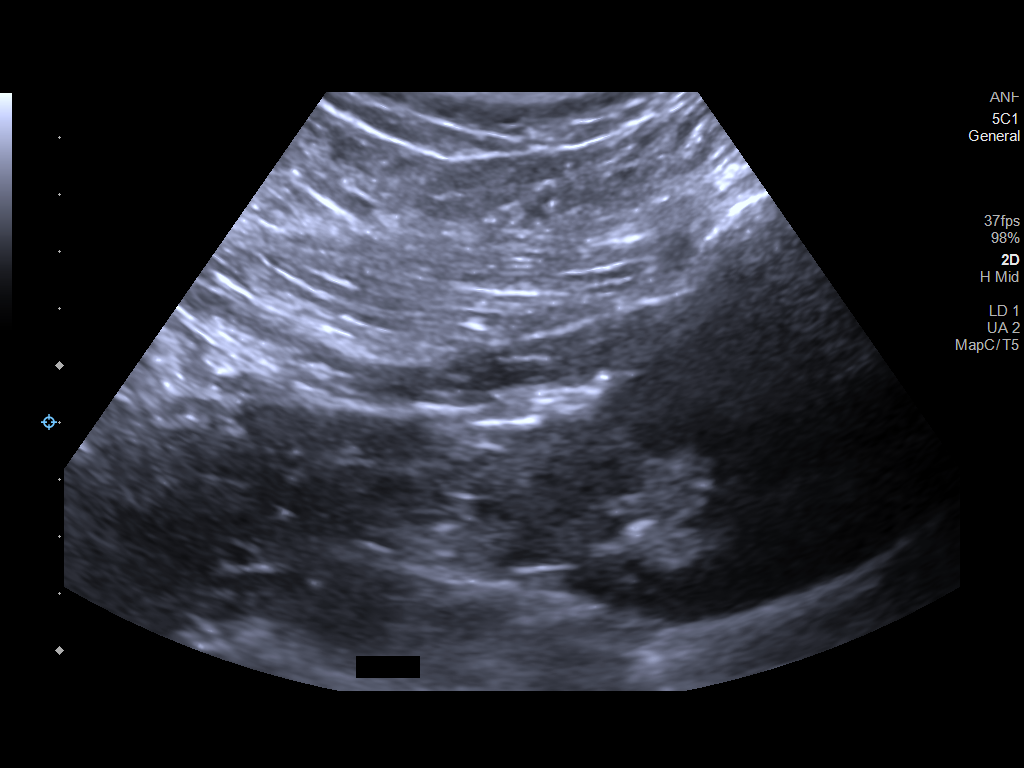

[6 of 6 positions shown; findings below may reference images not displayed]

EXAM:
ULTRASOUND-GUIDED RANDOM RENAL BIOPSY

MEDICATIONS:
Hydralazine 10 mg

ANESTHESIA/SEDATION:
Moderate (conscious) sedation was employed during this procedure. A
total of Versed 4.0 mg and Fentanyl 100 mcg was administered
intravenously.

Moderate Sedation Time: 23 minutes. The patient's level of
consciousness and vital signs were monitored continuously by
radiology nursing throughout the procedure under my direct
supervision.

FLUOROSCOPY TIME:  None

COMPLICATIONS:
None immediate.

PROCEDURE:
Informed written consent was obtained from the patient after a
thorough discussion of the procedural risks, benefits and
alternatives. All questions were addressed. Maximal Sterile Barrier
Technique was utilized including caps, mask, sterile gowns, sterile
gloves, sterile drape, hand hygiene and skin antiseptic. A timeout
was performed prior to the initiation of the procedure.

Patient was placed prone. Both kidneys were evaluated with
ultrasound. Left kidney was targeted for biopsy. The left flank was
prepped with chlorhexidine and sterile field was created. Skin was
anesthetized with 1% lidocaine. Using ultrasound guidance, 16 gauge
core needle was directed into the left kidney lower pole and a core
biopsy was obtained. Specimen placed in saline. A second core biopsy
was obtained from the left kidney lower pole using ultrasound
guidance. Second core biopsy was adequate. Specimen placed in
saline. Bandage placed over the puncture site.
FINDINGS: Core biopsies obtained from the left kidney lower pole cortex. No
significant bleeding or hematoma formation following the core
biopsies.
IMPRESSION: Ultrasound-guided core biopsies from the left kidney lower pole.

## 2021-05-05 DIAGNOSIS — N181 Chronic kidney disease, stage 1: Secondary | ICD-10-CM | POA: Diagnosis not present

## 2021-05-10 DIAGNOSIS — E876 Hypokalemia: Secondary | ICD-10-CM | POA: Diagnosis not present

## 2021-05-10 DIAGNOSIS — I129 Hypertensive chronic kidney disease with stage 1 through stage 4 chronic kidney disease, or unspecified chronic kidney disease: Secondary | ICD-10-CM | POA: Diagnosis not present

## 2021-05-10 DIAGNOSIS — E559 Vitamin D deficiency, unspecified: Secondary | ICD-10-CM | POA: Diagnosis not present

## 2021-05-10 DIAGNOSIS — N181 Chronic kidney disease, stage 1: Secondary | ICD-10-CM | POA: Diagnosis not present

## 2021-05-10 DIAGNOSIS — R600 Localized edema: Secondary | ICD-10-CM | POA: Diagnosis not present

## 2021-05-10 DIAGNOSIS — N022 Recurrent and persistent hematuria with diffuse membranous glomerulonephritis: Secondary | ICD-10-CM | POA: Diagnosis not present

## 2021-05-10 DIAGNOSIS — N049 Nephrotic syndrome with unspecified morphologic changes: Secondary | ICD-10-CM | POA: Diagnosis not present

## 2021-06-16 DIAGNOSIS — R809 Proteinuria, unspecified: Secondary | ICD-10-CM | POA: Diagnosis not present

## 2021-08-17 DIAGNOSIS — N022 Recurrent and persistent hematuria with diffuse membranous glomerulonephritis: Secondary | ICD-10-CM | POA: Diagnosis not present

## 2021-08-17 DIAGNOSIS — E559 Vitamin D deficiency, unspecified: Secondary | ICD-10-CM | POA: Diagnosis not present

## 2021-08-17 DIAGNOSIS — E876 Hypokalemia: Secondary | ICD-10-CM | POA: Diagnosis not present

## 2021-08-17 DIAGNOSIS — R600 Localized edema: Secondary | ICD-10-CM | POA: Diagnosis not present

## 2021-08-17 DIAGNOSIS — N049 Nephrotic syndrome with unspecified morphologic changes: Secondary | ICD-10-CM | POA: Diagnosis not present

## 2021-08-17 DIAGNOSIS — N181 Chronic kidney disease, stage 1: Secondary | ICD-10-CM | POA: Diagnosis not present

## 2021-08-17 DIAGNOSIS — I129 Hypertensive chronic kidney disease with stage 1 through stage 4 chronic kidney disease, or unspecified chronic kidney disease: Secondary | ICD-10-CM | POA: Diagnosis not present

## 2021-08-23 DIAGNOSIS — N022 Recurrent and persistent hematuria with diffuse membranous glomerulonephritis: Secondary | ICD-10-CM | POA: Diagnosis not present

## 2021-08-29 ENCOUNTER — Other Ambulatory Visit (HOSPITAL_COMMUNITY): Payer: Self-pay | Admitting: *Deleted

## 2021-08-30 ENCOUNTER — Ambulatory Visit (HOSPITAL_COMMUNITY)
Admission: RE | Admit: 2021-08-30 | Discharge: 2021-08-30 | Disposition: A | Discharge status: Home / Self Care | Payer: Medicaid Other | Source: Ambulatory Visit | Attending: Nephrology | Admitting: Nephrology

## 2021-08-30 DIAGNOSIS — N022 Recurrent and persistent hematuria with diffuse membranous glomerulonephritis: Secondary | ICD-10-CM | POA: Insufficient documentation

## 2021-08-30 MED ORDER — ACETAMINOPHEN 325 MG PO TABS: 650.0000 mg | ORAL_TABLET | Freq: Once | ORAL | Status: AC

## 2021-08-30 MED ORDER — METHYLPREDNISOLONE SODIUM SUCC 125 MG IJ SOLR
INTRAMUSCULAR | Status: AC
  Administered 2021-08-30: 125 mg via INTRAVENOUS
  Filled 2021-08-30: qty 2

## 2021-08-30 MED ORDER — ACETAMINOPHEN 325 MG PO TABS
ORAL_TABLET | ORAL | Status: AC
Start: 2021-08-30 — End: 2021-08-30
  Administered 2021-08-30: 650 mg via ORAL
  Filled 2021-08-30: qty 2

## 2021-08-30 MED ORDER — METHYLPREDNISOLONE SODIUM SUCC 125 MG IJ SOLR
125.0000 mg | Freq: Once | INTRAMUSCULAR | Status: AC
Start: 2021-08-30 — End: 2021-08-30

## 2021-08-30 MED ORDER — SODIUM CHLORIDE 0.9 % IV SOLN
1000.0000 mg | Freq: Once | INTRAVENOUS | Status: AC
  Administered 2021-08-30: 1000 mg via INTRAVENOUS
  Filled 2021-08-30: qty 100

## 2021-08-30 MED ORDER — DIPHENHYDRAMINE HCL 50 MG/ML IJ SOLN: 25.0000 mg | Freq: Once | INTRAMUSCULAR | Status: AC

## 2021-08-30 MED ORDER — DIPHENHYDRAMINE HCL 50 MG/ML IJ SOLN
INTRAMUSCULAR | Status: AC
  Administered 2021-08-30: 25 mg via INTRAVENOUS
  Filled 2021-08-30: qty 1

## 2021-09-08 DIAGNOSIS — N181 Chronic kidney disease, stage 1: Secondary | ICD-10-CM | POA: Diagnosis not present

## 2021-11-21 DIAGNOSIS — E876 Hypokalemia: Secondary | ICD-10-CM | POA: Diagnosis not present

## 2021-11-21 DIAGNOSIS — N022 Recurrent and persistent hematuria with diffuse membranous glomerulonephritis: Secondary | ICD-10-CM | POA: Diagnosis not present

## 2021-11-21 DIAGNOSIS — R600 Localized edema: Secondary | ICD-10-CM | POA: Diagnosis not present

## 2021-11-21 DIAGNOSIS — N049 Nephrotic syndrome with unspecified morphologic changes: Secondary | ICD-10-CM | POA: Diagnosis not present

## 2021-11-21 DIAGNOSIS — E559 Vitamin D deficiency, unspecified: Secondary | ICD-10-CM | POA: Diagnosis not present

## 2021-11-21 DIAGNOSIS — N181 Chronic kidney disease, stage 1: Secondary | ICD-10-CM | POA: Diagnosis not present

## 2021-11-21 DIAGNOSIS — I129 Hypertensive chronic kidney disease with stage 1 through stage 4 chronic kidney disease, or unspecified chronic kidney disease: Secondary | ICD-10-CM | POA: Diagnosis not present

## 2022-02-13 DIAGNOSIS — N022 Recurrent and persistent hematuria with diffuse membranous glomerulonephritis: Secondary | ICD-10-CM | POA: Diagnosis not present

## 2022-02-19 DIAGNOSIS — E559 Vitamin D deficiency, unspecified: Secondary | ICD-10-CM | POA: Diagnosis not present

## 2022-02-19 DIAGNOSIS — I129 Hypertensive chronic kidney disease with stage 1 through stage 4 chronic kidney disease, or unspecified chronic kidney disease: Secondary | ICD-10-CM | POA: Diagnosis not present

## 2022-02-19 DIAGNOSIS — N182 Chronic kidney disease, stage 2 (mild): Secondary | ICD-10-CM | POA: Diagnosis not present

## 2022-02-19 DIAGNOSIS — E876 Hypokalemia: Secondary | ICD-10-CM | POA: Diagnosis not present

## 2022-02-19 DIAGNOSIS — N022 Recurrent and persistent hematuria with diffuse membranous glomerulonephritis: Secondary | ICD-10-CM | POA: Diagnosis not present

## 2022-02-19 DIAGNOSIS — N049 Nephrotic syndrome with unspecified morphologic changes: Secondary | ICD-10-CM | POA: Diagnosis not present

## 2022-02-19 DIAGNOSIS — R600 Localized edema: Secondary | ICD-10-CM | POA: Diagnosis not present

## 2022-04-02 ENCOUNTER — Ambulatory Visit (HOSPITAL_COMMUNITY)
Admission: EM | Admit: 2022-04-02 | Discharge: 2022-04-02 | Disposition: A | Discharge status: Home / Self Care | Payer: Medicaid Other | Attending: Physician Assistant | Admitting: Physician Assistant

## 2022-04-02 ENCOUNTER — Other Ambulatory Visit: Payer: Self-pay

## 2022-04-02 ENCOUNTER — Encounter (HOSPITAL_COMMUNITY): Payer: Self-pay | Admitting: *Deleted

## 2022-04-02 DIAGNOSIS — R197 Diarrhea, unspecified: Secondary | ICD-10-CM

## 2022-04-02 DIAGNOSIS — R112 Nausea with vomiting, unspecified: Secondary | ICD-10-CM | POA: Diagnosis not present

## 2022-04-02 LAB — BASIC METABOLIC PANEL
Anion gap: 10 (ref 5–15)
BUN: 16 mg/dL (ref 6–20)
CO2: 24 mmol/L (ref 22–32)
Calcium: 8.9 mg/dL (ref 8.9–10.3)
Chloride: 104 mmol/L (ref 98–111)
Creatinine, Ser: 1.19 mg/dL — ABNORMAL HIGH (ref 0.44–1.00)
GFR, Estimated: 55 mL/min — ABNORMAL LOW (ref >60)
Glucose, Bld: 89 mg/dL (ref 70–99)
Potassium: 4 mmol/L (ref 3.5–5.1)
Sodium: 138 mmol/L (ref 135–145)

## 2022-04-02 MED ORDER — ONDANSETRON HCL 4 MG/5ML PO SOLN
ORAL | Status: AC
  Filled 2022-04-02: qty 5

## 2022-04-02 MED ORDER — ONDANSETRON HCL 4 MG/5ML PO SOLN
4.0000 mg | Freq: Once | ORAL | Status: AC
  Administered 2022-04-02: 4 mg via ORAL

## 2022-04-02 MED ORDER — ONDANSETRON 4 MG PO TBDP: 4.0000 mg | ORAL_TABLET | Freq: Once | ORAL | Status: DC

## 2022-04-02 MED ORDER — ONDANSETRON 4 MG PO TBDP: 4.0000 mg | ORAL_TABLET | Freq: Three times a day (TID) | ORAL | 0 refills | Status: AC | PRN

## 2022-04-02 NOTE — ED Notes (Signed)
Provider notified [facility] is out of ODT Zofran. Delivery expected at 1500.

## 2022-04-02 NOTE — ED Provider Notes (Signed)
[facility]    CSN: [id number] Arrival date & time: 04/02/22  8127      History   Chief Complaint Chief Complaint  Patient presents with   Emesis   Cough   Chills    HPI Mallory Lin is a 51 y.o. female.   Pt complains of nausea and vomiting.  Pt reports decreased fluid intake.  Pt is here with family member who is also sick.  Pt has a histroy of renal disease.   The history is provided by the patient. No language interpreter was used.  Emesis Severity:  Moderate Duration:  3 days Timing:  Constant Able to tolerate:  Liquids Associated symptoms: cough   Cough   Past Medical History:  Diagnosis Date   Bilateral leg edema 02/27/2019   BOILS, RECURRENT 01/12/2008   Qualifier: Diagnosis of  By: Deirdre Peer MD, Erin     COVID-19 virus infection 03/27/2019   Recurrent boils    UTI (lower urinary tract infection)     Patient Active Problem List   Diagnosis Date Noted   Overweight (BMI 25.0-29.9) 01/15/2013   Hidradenitis suppurativa 11/24/2010    Past Surgical History:  Procedure Laterality Date   HYDRADENITIS EXCISION Bilateral 06/06/2012   Procedure: EXCISION HYDRADENITIS AXILLA;  Surgeon: Velora Heckler, MD;  Location: [facility];  Service: General;  Laterality: Bilateral;   INCISE AND DRAIN ABCESS     for hidradenitis   INCISION AND DRAINAGE ABSCESS ANAL     teeth extracted     TUBAL LIGATION      OB History   No obstetric history on file.      Home Medications    Prior to Admission medications   Medication Sig Start Date End Date Taking? Authorizing Provider  ondansetron (ZOFRAN-ODT) 4 MG disintegrating tablet Take 1 tablet (4 mg total) by mouth every 8 (eight) hours as needed for nausea or vomiting. 04/02/22  Yes Cheron Schaumann K, PA-C  atorvastatin (LIPITOR) 10 MG tablet Take 10 mg by mouth daily. 02/15/20   [provider]  clindamycin (CLEOCIN T) 1 % SWAB Apply 1 Units topically 2 (two) times daily. Use in all  areas prone to abscess break outs. 12/22/20   Fayette Pho, MD  doxycycline (VIBRAMYCIN) 100 MG capsule Take 1 capsule (100 mg total) by mouth 2 (two) times daily. 07/13/19   Moshe Cipro, NP  furosemide (LASIX) 20 MG tablet Take 2 tablets (40 mg total) by mouth 2 (two) times daily for 14 days. 06/11/19 06/25/19  Cristina Gong, PA-C  lisinopril (ZESTRIL) 40 MG tablet Take 40 mg by mouth daily. 05/21/19   [provider]  losartan (COZAAR) 100 MG tablet Take 100 mg by mouth daily. 12/30/19   [provider]  losartan (COZAAR) 50 MG tablet Take 50 mg by mouth daily. 05/21/19   [provider]  methocarbamol (ROBAXIN) 500 MG tablet Take 1 tablet (500 mg total) by mouth every 8 (eight) hours as needed for muscle spasms. 12/08/20   Milagros Loll, MD  potassium chloride 20 MEQ TBCR Take 20 mEq by mouth daily for 14 days. 06/11/19 06/25/19  Cristina Gong, PA-C    Family History Family History  Problem Relation Age of Onset   Healthy Mother    Healthy Father     Social History Social History   Tobacco Use   Smoking status: Never   Smokeless tobacco: Never  Vaping Use   Vaping Use: Never used  Substance Use  Topics   Alcohol use: No   Drug use: No     Allergies   Patient has no known allergies.   Review of Systems Review of Systems  Respiratory:  Positive for cough.   Gastrointestinal:  Positive for vomiting.  All other systems reviewed and are negative.    Physical Exam Triage Vital Signs ED Triage Vitals  Enc Vitals Group     BP 04/02/22 1126 (!) 141/84     Pulse Rate 04/02/22 1126 81     Resp 04/02/22 1126 18     Temp 04/02/22 1126 99.8 F (37.7 C)     Temp src --      SpO2 04/02/22 1126 100 %     Weight --      Height --      Head Circumference --      Peak Flow --      Pain Score 04/02/22 1124 0     Pain Loc --      Pain Edu? --      Excl. in GC? --    No data found.  Updated Vital Signs BP (!) 141/84   Pulse 81    Temp 99.8 F (37.7 C)   Resp 18   LMP 07/04/2011   SpO2 100%   Visual Acuity Right Eye Distance:   Left Eye Distance:   Bilateral Distance:    Right Eye Near:   Left Eye Near:    Bilateral Near:     Physical Exam Vitals and nursing note reviewed.  Constitutional:      General: She is not in acute distress.    Appearance: She is well-developed.  HENT:     Head: Normocephalic and atraumatic.  Eyes:     Conjunctiva/sclera: Conjunctivae normal.  Cardiovascular:     Rate and Rhythm: Normal rate and regular rhythm.     Heart sounds: No murmur heard. Pulmonary:     Effort: Pulmonary effort is normal. No respiratory distress.     Breath sounds: Normal breath sounds.  Abdominal:     Palpations: Abdomen is soft.     Tenderness: There is no abdominal tenderness.  Musculoskeletal:        General: No swelling.     Cervical back: Neck supple.  Skin:    General: Skin is warm and dry.     Capillary Refill: Capillary refill takes less than 2 seconds.  Neurological:     Mental Status: She is alert.  Psychiatric:        Mood and Affect: Mood normal.      UC Treatments / Results  Labs (all labs ordered are listed, but only abnormal results are displayed) Labs Reviewed  BASIC METABOLIC PANEL - Abnormal; Notable for the following components:      Result Value   Creatinine, Ser 1.19 (*)    GFR, Estimated 55 (*)    All other components within normal limits  BASIC METABOLIC PANEL    EKG   Radiology No results found.  Procedures Procedures (including critical care time)  Medications Ordered in UC Medications  ondansetron (ZOFRAN-ODT) disintegrating tablet 4 mg (4 mg Oral Not Given 04/02/22 1212)  ondansetron (ZOFRAN) 4 MG/5ML solution 4 mg (4 mg Oral Given 04/02/22 1303)    Initial Impression / Assessment and Plan / UC Course  I have reviewed the triage vital signs and the nursing notes.  Pertinent labs & imaging results that were available during my care of the  patient were reviewed by  me and considered in my medical decision making (see chart for details).  Clinical Course as of 04/02/22 1510  Mon Apr 02, 2022  1347 Basic metabolic panel [LS]    Clinical Course User Index [LS] Elson Areas, [place]    I am going to check bmet due to reanl disease to make sure pt does not have an acute kidney injury.   Final Clinical Impressions(s) / UC Diagnoses   Final diagnoses:  None     Discharge Instructions      I suspect you have a viral illness.  I have ordered blood work to make sure that you are becoming dehydrated and causing more damage to your kidneys.  Continue taking tylenol for discomfort and fever.  Make sure you are drinking plenty of fluids.  See your Physician for recheck in 2-3 days    ED Prescriptions     Medication Sig Dispense Auth. Provider   ondansetron (ZOFRAN-ODT) 4 MG disintegrating tablet Take 1 tablet (4 mg total) by mouth every 8 (eight) hours as needed for nausea or vomiting. 20 tablet Elson Areas, [place]      PDMP not reviewed this encounter. An After Visit Summary was printed and given to the patient.    Elson Areas, [place] 04/02/22 2058

## 2022-04-02 NOTE — ED Triage Notes (Signed)
Pt reports chills,vomiting and nausea on Friday and SAT.

## 2022-04-02 NOTE — Discharge Instructions (Addendum)
I suspect you have a viral illness.  I have ordered blood work to make sure that you are becoming dehydrated and causing more damage to your kidneys.  Continue taking tylenol for discomfort and fever.  Make sure you are drinking plenty of fluids.  See your Physician for recheck in 2-3 days

## 2022-04-05 ENCOUNTER — Ambulatory Visit
Admission: EM | Admit: 2022-04-05 | Discharge: 2022-04-05 | Disposition: A | Discharge status: Home / Self Care | Payer: Medicaid Other | Attending: Family Medicine | Admitting: Family Medicine

## 2022-04-05 ENCOUNTER — Encounter: Payer: Self-pay | Admitting: Emergency Medicine

## 2022-04-05 DIAGNOSIS — H8110 Benign paroxysmal vertigo, unspecified ear: Secondary | ICD-10-CM | POA: Diagnosis not present

## 2022-04-05 DIAGNOSIS — R42 Dizziness and giddiness: Secondary | ICD-10-CM

## 2022-04-05 MED ORDER — MECLIZINE HCL 25 MG PO TABS: 25.0000 mg | ORAL_TABLET | Freq: Three times a day (TID) | ORAL | 0 refills | Status: DC | PRN

## 2022-04-05 NOTE — ED Triage Notes (Signed)
Pt is present today with c/o fatigue, nausea, and dizziness.   Onset ~ 1 week

## 2022-04-05 NOTE — ED Provider Notes (Signed)
[place]   [place]:[id number] 04/05/22 Arrival Time: 0930  ASSESSMENT & PLAN:  1. Lightheadedness   2. Benign paroxysmal positional vertigo, unspecified laterality    Symptoms consistent with BPPV. Discussed. Labs pending.  Orders Placed This Encounter  Procedures   CBC   Basic metabolic panel   TSH   Ambulatory referral to ENT   Trial of: Meds ordered this encounter  Medications   meclizine (ANTIVERT) 25 MG tablet    Sig: Take 1 tablet (25 mg total) by mouth 3 (three) times daily as needed for dizziness.    Dispense:  30 tablet    Refill:  0   Reassured that these symptoms do not appear to represent a serious or threatening condition. This is generally a self-limited temporary but uncomfortable situation. Rest, avoid potentially dangerous activities (such as driving or working with machinery or at heights). Use OTC Meclizine prn. Will proceed to the ED if she develops other symptoms such as alterations of speech, swallowing, vision, motor/sensory systems, or if dizziness worsens.  Reviewed expectations re: course of current medical issues. Questions answered. Outlined signs and symptoms indicating need for more acute intervention. Patient verbalized understanding. After Visit Summary given.   SUBJECTIVE:  Mallory Lin is a 51 y.o. female who reports URI symptoms; past 1.5 weeks. Somewhat better. Has been  "feeling dizzy" for the past 1.5 weeks. Describes vertigo. Worse with certain movements, particularly from supine to sitting. Nausea; controlled with Zofran given at recent visit. No assoc CP/SOB/diaphoresis. Normal bowel/bladder habits. Normal ambulation. No extremity sensation changes or weakness.    Social History   Substance and Sexual Activity  Alcohol Use No   Social History   Tobacco Use  Smoking Status Never  Smokeless Tobacco Never   Denies recreational drug use.   OBJECTIVE:  Vitals:   04/05/22 1037 04/05/22 1039  BP:  (!) 175/86   Pulse: 65   Resp: 18   Temp: 98.2 F (36.8 C)   SpO2: 100%     General appearance: alert; no distress Eyes: PERRLA; EOMI; conjunctiva normal HENT: normocephalic; atraumatic; TMs normal; nasal mucosa normal; oral mucosa normal Neck: supple with FROM Lungs: clear to auscultation bilaterally Heart: regular rate and rhythm Abdomen: soft, non-tender; bowel sounds normal Extremities: no cyanosis or edema; symmetrical with no gross deformities Skin: warm and dry Neurologic: normal gait; DTR's normal and symmetric; CN 2-12 grossly intact; rapid changes in position during the exam do precipitate brief dizziness without apparent nystagmus. Psychological: alert and cooperative; normal mood and affect  Investigations: Results for orders placed or performed during the hospital encounter of 0000000  Basic metabolic panel  Result Value Ref Range   Sodium 138 135 - 145 mmol/L   Potassium 4.0 3.5 - 5.1 mmol/L   Chloride 104 98 - 111 mmol/L   CO2 24 22 - 32 mmol/L   Glucose, Bld 89 70 - 99 mg/dL   BUN 16 6 - 20 mg/dL   Creatinine, Ser 1.19 (H) 0.44 - 1.00 mg/dL   Calcium 8.9 8.9 - 10.3 mg/dL   GFR, Estimated 55 (L) >60 mL/min   Anion gap 10 5 - 15   Labs Reviewed  CBC  BASIC METABOLIC PANEL  TSH   No results found.  No Known Allergies  Past Medical History:  Diagnosis Date   Bilateral leg edema 02/27/2019   BOILS, RECURRENT 01/12/2008   Qualifier: Diagnosis of  By: Carmie End MD, Erin     COVID-19 virus infection 03/27/2019   Recurrent boils  UTI (lower urinary tract infection)    Social History   Socioeconomic History   Marital status: Single    Spouse name: Not on file   Number of children: Not on file   Years of education: Not on file   Highest education level: Not on file  Occupational History   Not on file  Tobacco Use   Smoking status: Never   Smokeless tobacco: Never  Vaping Use   Vaping Use: Never used  Substance and Sexual Activity   Alcohol use: No   Drug  use: No   Sexual activity: Never    Birth control/protection: Surgical  Other Topics Concern   Not on file  Social History Narrative   Not on file   Social Determinants of Health   Financial Resource Strain: Not on file  Food Insecurity: Not on file  Transportation Needs: Not on file  Physical Activity: Not on file  Stress: Not on file  Social Connections: Not on file  Intimate Partner Violence: Not on file   Family History  Problem Relation Age of Onset   Healthy Mother    Healthy Father    Past Surgical History:  Procedure Laterality Date   HYDRADENITIS EXCISION Bilateral 06/06/2012   Procedure: EXCISION HYDRADENITIS AXILLA;  Surgeon: Velora Heckler, MD;  Location: [facility];  Service: General;  Laterality: Bilateral;   INCISE AND DRAIN ABCESS     for hidradenitis   INCISION AND DRAINAGE ABSCESS ANAL     teeth extracted     TUBAL LIGATION         Mardella Layman, MD 04/05/22 1127

## 2022-04-05 NOTE — Discharge Instructions (Addendum)
Your blood pressure was noted to be elevated during your visit today. If you are currently taking medication for high blood pressure, please ensure you are taking this as directed. If you do not have a history of high blood pressure and your blood pressure remains persistently elevated, you may need to begin taking a medication at some point. You may return here within the next few days to recheck if unable to see your primary care provider or if you do not have a one.  BP (!) 175/86   Pulse 65   Temp 98.2 F (36.8 C)   Resp 18   LMP 07/04/2011   SpO2 100%   BP Readings from Last 3 Encounters:  04/05/22 (!) 175/86  04/02/22 (!) 141/84  08/30/21 (!) 150/81    You have had labs (blood tests) sent today. We will call you with any significant abnormalities or if there is need to begin or change treatment or pursue further follow up.  You may also review your test results online through MyChart. If you do not have a MyChart account, instructions to sign up should be on your discharge paperwork.

## 2022-04-06 LAB — BASIC METABOLIC PANEL
BUN/Creatinine Ratio: 16 (ref 9–23)
BUN: 16 mg/dL (ref 6–24)
CO2: 24 mmol/L (ref 20–29)
Calcium: 9.2 mg/dL (ref 8.7–10.2)
Chloride: 104 mmol/L (ref 96–106)
Creatinine, Ser: 1 mg/dL (ref 0.57–1.00)
Glucose: 100 mg/dL — ABNORMAL HIGH (ref 70–99)
Potassium: 4.7 mmol/L (ref 3.5–5.2)
Sodium: 142 mmol/L (ref 134–144)
eGFR: 68 mL/min/{1.73_m2} (ref >59)

## 2022-04-06 LAB — CBC
Hematocrit: 38.2 % (ref 34.0–46.6)
Hemoglobin: 12.9 g/dL (ref 11.1–15.9)
MCH: 30.2 pg (ref 26.6–33.0)
MCHC: 33.8 g/dL (ref 31.5–35.7)
MCV: 90 fL (ref 79–97)
Platelets: 213 10*3/uL (ref 150–450)
RBC: 4.27 x10E6/uL (ref 3.77–5.28)
RDW: 12.4 % (ref 11.7–15.4)
WBC: 5.4 10*3/uL (ref 3.4–10.8)

## 2022-04-06 LAB — TSH: TSH: 0.762 u[IU]/mL (ref 0.450–4.500)

## 2022-04-13 ENCOUNTER — Ambulatory Visit: Payer: Medicaid Other | Admitting: Student

## 2022-04-13 ENCOUNTER — Encounter: Payer: Self-pay | Admitting: Student

## 2022-04-13 VITALS — BP 118/68 | HR 92 | Wt 182.2 lb

## 2022-04-13 DIAGNOSIS — R42 Dizziness and giddiness: Secondary | ICD-10-CM

## 2022-04-13 DIAGNOSIS — L732 Hidradenitis suppurativa: Secondary | ICD-10-CM

## 2022-04-13 MED ORDER — DOXYCYCLINE HYCLATE 100 MG PO CAPS: 100.0000 mg | ORAL_CAPSULE | Freq: Two times a day (BID) | ORAL | 0 refills | Status: AC

## 2022-04-13 MED ORDER — CLINDAMYCIN PHOSPHATE 1 % EX SWAB: 1.0000 [IU] | Freq: Two times a day (BID) | CUTANEOUS | 2 refills | Status: DC

## 2022-04-13 NOTE — Progress Notes (Cosign Needed)
    SUBJECTIVE:   CHIEF COMPLAINT / HPI:   Patient is a 51 year old female presenting follow up after ED visit. Recently seen for vertigo and lightheadedness inn the ED Was started on meclizine 25mg  TID Patient report symptoms sightly improved on Meclizine No longer having the nausea or vomiting Still endorses "woozy feeling, room spinning" all the time regardless of position Denies any ear pain, ringing in ear, or headache No recent head trauma. Symptom has been persistent for 2 weeks with no identifiable triggers  Hx of HS Patient reports reoccurrence of cyst on her under arm bilaterally and pelvic region The cyct is pain and started couple of days ago This is similar to her prior HS experience Was worse in the past requiring surgery Topical clinda swab has been helpful in the past. She denies any fevers, chills or draining cyst.  PERTINENT  PMH / PSH: Reviewed   OBJECTIVE:   BP 118/68   Pulse 92   Wt 182 lb 3.2 oz (82.6 kg)   LMP 07/04/2011   SpO2 99%   BMI 29.41 kg/m    Physical Exam General: Alert, well appearing, NAD Cardiovascular: RRR, No Murmurs, Normal S2/S2 Respiratory: CTAB, No wheezing or Rales Abdomen: No distension or tenderness Extremities: notable 3cm mobile mildly tender mass on the under arm bilaterally. Non draining  CMA Tashira served as Chaperone for this exam   ASSESSMENT/PLAN:   Hidradenitis suppurativa Rx Doxycyline 100mg  BID for 10 days and Rx Clinda swab to be applied on affected areas.   Vertigo Patient still appears symptomatic despite treatment with Meclizine. Unable to perform the Dix-Hallpike test or Epley Maneuver due to patient being anxious about nausea. Her presentation with constant vertigo that is unchanged with position is no consistent with BPPV and other differentia include possible labyrinthitis, CVA. Will obtain MRI to assess for CVA.  Placed ambulatory referral to neurology for further evaluation with the Vestibular clinic.  Reviewed return precaution with patient   07/06/2011, MD [facility]

## 2022-04-13 NOTE — Patient Instructions (Signed)
It was wonderful to meet you today. Thank you for allowing me to be a part of your care. Below is a short summary of what we discussed at your visit today:  I have placed a neurology referral for you dizzy spell which is likely due to Vertigo  Also sent in antibiotics for your your cyst which is due to your Hydradenitis suppurativa   Please bring all of your medications to every appointment!  If you have any questions or concerns, please do not hesitate to contact us via phone or MyChart message.   Jerre Simon, MD [facility]

## 2022-04-13 NOTE — Assessment & Plan Note (Signed)
Rx Doxycyline 100mg  BID for 10 days and Rx Clinda swab to be applied on affected areas.

## 2022-04-19 ENCOUNTER — Encounter: Payer: Self-pay | Admitting: Neurology

## 2022-05-11 ENCOUNTER — Other Ambulatory Visit: Payer: Self-pay | Admitting: Family Medicine

## 2022-05-11 ENCOUNTER — Ambulatory Visit
Admission: RE | Admit: 2022-05-11 | Discharge: 2022-05-11 | Disposition: A | Discharge status: Home / Self Care | Payer: Medicaid Other | Source: Ambulatory Visit | Attending: Family Medicine | Admitting: Family Medicine

## 2022-05-11 DIAGNOSIS — J3489 Other specified disorders of nose and nasal sinuses: Secondary | ICD-10-CM | POA: Diagnosis not present

## 2022-05-11 DIAGNOSIS — R42 Dizziness and giddiness: Secondary | ICD-10-CM

## 2022-05-11 DIAGNOSIS — R29818 Other symptoms and signs involving the nervous system: Secondary | ICD-10-CM | POA: Diagnosis not present

## 2022-05-11 DIAGNOSIS — L732 Hidradenitis suppurativa: Secondary | ICD-10-CM

## 2022-05-23 DIAGNOSIS — E876 Hypokalemia: Secondary | ICD-10-CM | POA: Diagnosis not present

## 2022-05-23 DIAGNOSIS — I129 Hypertensive chronic kidney disease with stage 1 through stage 4 chronic kidney disease, or unspecified chronic kidney disease: Secondary | ICD-10-CM | POA: Diagnosis not present

## 2022-05-23 DIAGNOSIS — N022 Recurrent and persistent hematuria with diffuse membranous glomerulonephritis: Secondary | ICD-10-CM | POA: Diagnosis not present

## 2022-05-23 DIAGNOSIS — R600 Localized edema: Secondary | ICD-10-CM | POA: Diagnosis not present

## 2022-05-23 DIAGNOSIS — E559 Vitamin D deficiency, unspecified: Secondary | ICD-10-CM | POA: Diagnosis not present

## 2022-05-23 DIAGNOSIS — N049 Nephrotic syndrome with unspecified morphologic changes: Secondary | ICD-10-CM | POA: Diagnosis not present

## 2022-05-23 DIAGNOSIS — N182 Chronic kidney disease, stage 2 (mild): Secondary | ICD-10-CM | POA: Diagnosis not present

## 2022-05-25 LAB — LAB REPORT - SCANNED: Microalb Creat Ratio: 818

## 2022-06-06 ENCOUNTER — Encounter: Payer: Self-pay | Admitting: Student

## 2022-06-06 ENCOUNTER — Ambulatory Visit (INDEPENDENT_AMBULATORY_CARE_PROVIDER_SITE_OTHER): Payer: Medicaid Other | Admitting: Student

## 2022-06-06 VITALS — BP 138/78 | HR 70 | Ht 66.0 in | Wt 190.0 lb

## 2022-06-06 DIAGNOSIS — N3090 Cystitis, unspecified without hematuria: Secondary | ICD-10-CM | POA: Diagnosis not present

## 2022-06-06 LAB — POCT URINALYSIS DIP (MANUAL ENTRY)
Bilirubin, UA: NEGATIVE
Glucose, UA: >=1000 mg/dL — AB
Ketones, POC UA: NEGATIVE mg/dL
Nitrite, UA: NEGATIVE
Protein Ur, POC: 100 mg/dL — AB
Spec Grav, UA: 1.02 (ref 1.010–1.025)
Urobilinogen, UA: 0.2 E.U./dL
pH, UA: 5.5 (ref 5.0–8.0)

## 2022-06-06 LAB — POCT UA - MICROSCOPIC ONLY

## 2022-06-06 NOTE — Patient Instructions (Signed)
It was wonderful to see you today. Thank you for allowing me to be a part of your care. Below is a short summary of what we discussed at your visit today:  Your urine analysis today showed very little white blood cells and was inconclusive. I will send your urine for culture.   I suspect the findings from your nephrologist might have been a contaminated sample.  Please bring all of your medications to every appointment!  If you have any questions or concerns, please do not hesitate to contact us via phone or MyChart message.   Alen Bleacher, MD [facility]

## 2022-06-06 NOTE — Progress Notes (Signed)
    SUBJECTIVE:   CHIEF COMPLAINT / HPI:   52 year old female presenting today for concerns of UTI.  Reported she was informed by her nephrologist that she could possibly have a bladder infection or a contaminated sample and recommend she follows up with her PCP.  She is asymptomatic and denies any dysuria, suprapubic pain, lower back pain, fever or chills.  PERTINENT  PMH / PSH: Review   OBJECTIVE:   Ht 5' 6"$  (1.676 m)   Wt 190 lb (86.2 kg)   LMP 07/04/2011   BMI 30.67 kg/m    Physical Exam General: Alert, well appearing, NAD Cardiovascular: RRR, well perfused  Respiratory: Normal work of breathing on room air Abdomen: No distension or tenderness Musculoskeletal: No CVA tenderness  ASSESSMENT/PLAN:   UTI Urine analysis is inconclusive for UTI, negative with trace leukocytes.  Suspect her findings from her nephrologist is likely a contaminated sample given patient is currently asymptomatic.  However we will send urine sample for culture.  UA did show anuria and patient is being monitored by nephrologist.  Reviewed return precaution with patient which verbalized understanding and agreeable to plan.      Alen Bleacher, MD [place]

## 2022-06-08 LAB — URINE CULTURE

## 2022-08-22 NOTE — Progress Notes (Deleted)
NEUROLOGY CONSULTATION NOTE  CHESNEE BOCHE MRN: [id number] DOB: [birth year]  Referring provider: Carney Living, MD Primary care provider: Jerre Simon, MD  Reason for consult:  vertigo  Assessment/Plan:   ***   Subjective:  Mallory Lin is a 52 year old female who presents for vertigo.  History supplemented by ED and PCP notes.   In December, she began experiencing upper respiratory symptoms as well as dizziness. Describes ***.  Aggravated by ***.  Associated nausea and vomiting.  No headache, ear pain, tinnitus, hearing loss, double vision, slurred speech or unilateral numbness or weakness.  Went to the ED on 12/21.  Prescribed meclizine ***.  She had an MRI of the brain without contrast on 05/11/2022 which was personally reviewed which revealed mild scattered nonspecific punctate T2/FLAIR hyperintensites within the cerebral white matter within normal limits.     PAST MEDICAL HISTORY: Past Medical History:  Diagnosis Date   Bilateral leg edema 02/27/2019   BOILS, RECURRENT 01/12/2008   Qualifier: Diagnosis of  By: Deirdre Peer MD, Erin     COVID-19 virus infection 03/27/2019   Recurrent boils    UTI (lower urinary tract infection)     PAST SURGICAL HISTORY: Past Surgical History:  Procedure Laterality Date   HYDRADENITIS EXCISION Bilateral 06/06/2012   Procedure: EXCISION HYDRADENITIS AXILLA;  Surgeon: Velora Heckler, MD;  Location: [facility];  Service: General;  Laterality: Bilateral;   INCISE AND DRAIN ABCESS     for hidradenitis   INCISION AND DRAINAGE ABSCESS ANAL     teeth extracted     TUBAL LIGATION      MEDICATIONS: Current Outpatient Medications on File Prior to Visit  Medication Sig Dispense Refill   atorvastatin (LIPITOR) 10 MG tablet Take 10 mg by mouth daily.     clindamycin (CLEOCIN T) 1 % SWAB Apply 1 Units topically 2 (two) times daily. Use in all areas prone to abscess break outs. 69 each 2   furosemide (LASIX) 20 MG tablet Take  2 tablets (40 mg total) by mouth 2 (two) times daily for 14 days. 56 tablet 0   lisinopril (ZESTRIL) 40 MG tablet Take 40 mg by mouth daily.     losartan (COZAAR) 100 MG tablet Take 100 mg by mouth daily.     losartan (COZAAR) 50 MG tablet Take 50 mg by mouth daily.     meclizine (ANTIVERT) 25 MG tablet Take 1 tablet (25 mg total) by mouth 3 (three) times daily as needed for dizziness. 30 tablet 0   methocarbamol (ROBAXIN) 500 MG tablet Take 1 tablet (500 mg total) by mouth every 8 (eight) hours as needed for muscle spasms. 20 tablet 0   ondansetron (ZOFRAN-ODT) 4 MG disintegrating tablet Take 1 tablet (4 mg total) by mouth every 8 (eight) hours as needed for nausea or vomiting. 20 tablet 0   potassium chloride 20 MEQ TBCR Take 20 mEq by mouth daily for 14 days. 14 tablet 0   No current facility-administered medications on file prior to visit.    ALLERGIES: No Known Allergies  FAMILY HISTORY: Family History  Problem Relation Age of Onset   Healthy Mother    Healthy Father     Objective:  *** General: No acute distress.  Patient appears well-groomed.   Head:  Normocephalic/atraumatic Eyes:  fundi examined but not visualized Neck: supple, no paraspinal tenderness, full range of motion Back: No paraspinal tenderness Heart: regular rate and rhythm Lungs: Clear to auscultation bilaterally. Vascular: No carotid  bruits. Neurological Exam: Mental status: alert and oriented to person, place, and time, speech fluent and not dysarthric, language intact. Cranial nerves: CN I: not tested CN II: pupils equal, round and reactive to light, visual fields intact CN III, IV, VI:  full range of motion, no nystagmus, no ptosis CN V: facial sensation intact. CN VII: upper and lower face symmetric CN VIII: hearing intact CN IX, X: gag intact, uvula midline CN XI: sternocleidomastoid and trapezius muscles intact CN XII: tongue midline Bulk & Tone: normal, no fasciculations. Motor:  muscle  strength 5/5 throughout Sensation:  Pinprick, temperature and vibratory sensation intact. Deep Tendon Reflexes:  2+ throughout,  toes downgoing.   Finger to nose testing:  Without dysmetria.   Heel to shin:  Without dysmetria.   Gait:  Normal station and stride.  Romberg negative.    Thank you for allowing me to take part in the care of this patient.  Shon Millet, DO  CC: ***

## 2022-08-23 ENCOUNTER — Ambulatory Visit: Payer: Medicaid Other | Admitting: Neurology

## 2022-09-21 DIAGNOSIS — N182 Chronic kidney disease, stage 2 (mild): Secondary | ICD-10-CM | POA: Diagnosis not present

## 2022-09-21 DIAGNOSIS — I129 Hypertensive chronic kidney disease with stage 1 through stage 4 chronic kidney disease, or unspecified chronic kidney disease: Secondary | ICD-10-CM | POA: Diagnosis not present

## 2022-09-21 DIAGNOSIS — N022 Recurrent and persistent hematuria with diffuse membranous glomerulonephritis: Secondary | ICD-10-CM | POA: Diagnosis not present

## 2022-09-21 DIAGNOSIS — R600 Localized edema: Secondary | ICD-10-CM | POA: Diagnosis not present

## 2022-09-21 DIAGNOSIS — N049 Nephrotic syndrome with unspecified morphologic changes: Secondary | ICD-10-CM | POA: Diagnosis not present

## 2022-09-21 DIAGNOSIS — E559 Vitamin D deficiency, unspecified: Secondary | ICD-10-CM | POA: Diagnosis not present

## 2022-09-21 DIAGNOSIS — E876 Hypokalemia: Secondary | ICD-10-CM | POA: Diagnosis not present

## 2022-10-23 ENCOUNTER — Other Ambulatory Visit: Payer: Self-pay

## 2022-10-23 ENCOUNTER — Telehealth: Payer: Self-pay

## 2022-10-23 DIAGNOSIS — Z1231 Encounter for screening mammogram for malignant neoplasm of breast: Secondary | ICD-10-CM

## 2022-10-23 NOTE — Telephone Encounter (Signed)
Chart review completed for patient. Patient is due for screening mammogram. I spoke with Misty Stanley and discussed the mammogram and appointment options. We scheduled her for the mobile mammogram unit on 11/08/22. Elijio Miles, Population Health Specialist.

## 2022-11-05 NOTE — Progress Notes (Unsigned)
NEUROLOGY CONSULTATION NOTE  THAILA BOTTOMS MRN: [id number] DOB: September 23, 1970  Referring provider: Pearlean Brownie, MD Primary care provider: Jerre Simon, MD  Reason for consult:  vertigo  Assessment/Plan:   Dizziness, resolved.  May have been related to upper respiratory infection.  Not consistent with BPPV.  Primary neurologic etiology not suspected.   Subjective:  Mallory Lin is a 52 year old female who presents for vertigo.  History supplemented by ED and primary care notes.    In December, she developed upper respiratory symptoms and dizziness.  She endorsed sensation of lightheadedness or that she was going to pass out.  She does not describe a spinning sensation.  Would occur if she got up and was walking around.  Improved when resting in bed.  No headache, numbness, slurred speech, weakness, tinnitus, double vision.  Seen in ED where she was diagnosed with BPPV.  Symptoms subsequently resolved after several days.  Followed up with her PCP who ordered MRI of brain without contrast, performed on 05/11/2022, which revealed few small T2/FLAIR hyperintensities in the cerebral white matter within normal limits but no acute abnormalities.  No recurrence.   PAST MEDICAL HISTORY: Past Medical History:  Diagnosis Date   Bilateral leg edema 02/27/2019   BOILS, RECURRENT 01/12/2008   Qualifier: Diagnosis of  By: Deirdre Peer MD, Erin     COVID-19 virus infection 03/27/2019   Recurrent boils    UTI (lower urinary tract infection)     PAST SURGICAL HISTORY: Past Surgical History:  Procedure Laterality Date   HYDRADENITIS EXCISION Bilateral 06/06/2012   Procedure: EXCISION HYDRADENITIS AXILLA;  Surgeon: Velora Heckler, MD;  Location: [facility];  Service: General;  Laterality: Bilateral;   INCISE AND DRAIN ABCESS     for hidradenitis   INCISION AND DRAINAGE ABSCESS ANAL     teeth extracted     TUBAL LIGATION      MEDICATIONS: Current Outpatient Medications on  File Prior to Visit  Medication Sig Dispense Refill   atorvastatin (LIPITOR) 10 MG tablet Take 10 mg by mouth daily.     clindamycin (CLEOCIN T) 1 % SWAB Apply 1 Units topically 2 (two) times daily. Use in all areas prone to abscess break outs. 69 each 2   furosemide (LASIX) 20 MG tablet Take 2 tablets (40 mg total) by mouth 2 (two) times daily for 14 days. 56 tablet 0   lisinopril (ZESTRIL) 40 MG tablet Take 40 mg by mouth daily.     losartan (COZAAR) 100 MG tablet Take 100 mg by mouth daily.     losartan (COZAAR) 50 MG tablet Take 50 mg by mouth daily.     meclizine (ANTIVERT) 25 MG tablet Take 1 tablet (25 mg total) by mouth 3 (three) times daily as needed for dizziness. 30 tablet 0   methocarbamol (ROBAXIN) 500 MG tablet Take 1 tablet (500 mg total) by mouth every 8 (eight) hours as needed for muscle spasms. 20 tablet 0   ondansetron (ZOFRAN-ODT) 4 MG disintegrating tablet Take 1 tablet (4 mg total) by mouth every 8 (eight) hours as needed for nausea or vomiting. 20 tablet 0   potassium chloride 20 MEQ TBCR Take 20 mEq by mouth daily for 14 days. 14 tablet 0   No current facility-administered medications on file prior to visit.    ALLERGIES: No Known Allergies  FAMILY HISTORY: Family History  Problem Relation Age of Onset   Healthy Mother    Healthy Father  Objective:  Blood pressure (!) 146/70, pulse 76, height 5\' 7"  (1.702 m), weight 190 lb (86.2 kg), last menstrual period 07/04/2011, SpO2 98%. General: No acute distress.  Patient appears well-groomed.   Head:  Normocephalic/atraumatic Eyes:  fundi examined but not visualized Neck: supple, no paraspinal tenderness, full range of motion Back: No paraspinal tenderness Heart: regular rate and rhythm Lungs: Clear to auscultation bilaterally. Vascular: No carotid bruits. Neurological Exam: Mental status: alert and oriented to person, place, and time, speech fluent and not dysarthric, language intact. Cranial nerves: CN I:  not tested CN II: pupils equal, round and reactive to light, visual fields intact CN III, IV, VI:  full range of motion, no nystagmus, no ptosis CN V: facial sensation intact. CN VII: upper and lower face symmetric CN VIII: hearing intact CN IX, X: gag intact, uvula midline CN XI: sternocleidomastoid and trapezius muscles intact CN XII: tongue midline Bulk & Tone: normal, no fasciculations. Motor:  muscle strength 5/5 throughout Sensation:  Pinprick, temperature and vibratory sensation intact. Deep Tendon Reflexes:  2+ throughout,  toes downgoing.   Finger to nose testing:  Without dysmetria.   Heel to shin:  Without dysmetria.   Gait:  Normal station and stride.  Romberg negative.    Thank you for allowing me to take part in the care of this patient.  Shon Millet, DO  CC:  Jerre Simon, MD  Pearlean Brownie, MD

## 2022-11-06 ENCOUNTER — Ambulatory Visit: Payer: Medicaid Other | Admitting: Neurology

## 2022-11-06 ENCOUNTER — Encounter: Payer: Self-pay | Admitting: Neurology

## 2022-11-06 VITALS — BP 146/70 | HR 76 | Ht 67.0 in | Wt 190.0 lb

## 2022-11-06 DIAGNOSIS — R42 Dizziness and giddiness: Secondary | ICD-10-CM

## 2022-11-06 NOTE — Patient Instructions (Signed)
The dizziness was likely related to the cold.  I don't suspect anything neurologic.

## 2022-11-08 ENCOUNTER — Inpatient Hospital Stay: Admission: RE | Admit: 2022-11-08 | Payer: Medicaid Other | Source: Ambulatory Visit

## 2023-02-06 DIAGNOSIS — E559 Vitamin D deficiency, unspecified: Secondary | ICD-10-CM | POA: Diagnosis not present

## 2023-02-06 DIAGNOSIS — E876 Hypokalemia: Secondary | ICD-10-CM | POA: Diagnosis not present

## 2023-02-06 DIAGNOSIS — N049 Nephrotic syndrome with unspecified morphologic changes: Secondary | ICD-10-CM | POA: Diagnosis not present

## 2023-02-06 DIAGNOSIS — R600 Localized edema: Secondary | ICD-10-CM | POA: Diagnosis not present

## 2023-02-06 DIAGNOSIS — I10 Essential (primary) hypertension: Secondary | ICD-10-CM | POA: Diagnosis not present

## 2023-02-06 DIAGNOSIS — N182 Chronic kidney disease, stage 2 (mild): Secondary | ICD-10-CM | POA: Diagnosis not present

## 2023-02-06 DIAGNOSIS — N022 Recurrent and persistent hematuria with diffuse membranous glomerulonephritis: Secondary | ICD-10-CM | POA: Diagnosis not present

## 2023-02-11 ENCOUNTER — Ambulatory Visit (INDEPENDENT_AMBULATORY_CARE_PROVIDER_SITE_OTHER): Payer: Medicaid Other

## 2023-02-11 DIAGNOSIS — Z111 Encounter for screening for respiratory tuberculosis: Secondary | ICD-10-CM | POA: Diagnosis not present

## 2023-02-11 NOTE — Progress Notes (Signed)
Patient presents to nurse clinic for PPD placement.  PPD placed in left forearm. Patient to return on 02/14/2023 for PPD read.

## 2023-02-14 ENCOUNTER — Ambulatory Visit: Payer: Medicaid Other

## 2023-02-14 DIAGNOSIS — Z111 Encounter for screening for respiratory tuberculosis: Secondary | ICD-10-CM

## 2023-02-14 LAB — TB SKIN TEST
Induration: 0 mm
TB Skin Test: NEGATIVE

## 2023-02-14 NOTE — Progress Notes (Signed)
PPD Reading Note PPD read and results entered in EpicCare. Result: 0 mm induration. Interpretation: Negative Allergic reaction: No  

## 2023-06-07 ENCOUNTER — Ambulatory Visit (INDEPENDENT_AMBULATORY_CARE_PROVIDER_SITE_OTHER): Payer: Medicaid Other | Admitting: Student

## 2023-06-07 ENCOUNTER — Other Ambulatory Visit (HOSPITAL_COMMUNITY)
Admission: RE | Admit: 2023-06-07 | Discharge: 2023-06-07 | Disposition: A | Discharge status: Home / Self Care | Payer: Medicaid Other | Source: Ambulatory Visit | Attending: Family Medicine | Admitting: Family Medicine

## 2023-06-07 ENCOUNTER — Ambulatory Visit: Payer: Medicaid Other

## 2023-06-07 VITALS — BP 160/88 | HR 62 | Ht 67.0 in | Wt 187.4 lb

## 2023-06-07 DIAGNOSIS — N898 Other specified noninflammatory disorders of vagina: Secondary | ICD-10-CM | POA: Diagnosis not present

## 2023-06-07 DIAGNOSIS — I10 Essential (primary) hypertension: Secondary | ICD-10-CM | POA: Diagnosis not present

## 2023-06-07 LAB — POCT WET PREP (WET MOUNT)
Clue Cells Wet Prep Whiff POC: NEGATIVE
Trichomonas Wet Prep HPF POC: ABSENT
WBC, Wet Prep HPF POC: >20

## 2023-06-07 NOTE — Progress Notes (Signed)
    SUBJECTIVE:   CHIEF COMPLAINT / HPI:   Vaginal Discharge Present for several days. White, thin discharge. No particular odor.  No associated pain or itching.  No new sexual partners but does tell me that her boyfriend has been "acting up".  She does not elaborate on what this means.  Hypertension Tells me that she has not taken her medicine in a few days because she has not had it from the pharmacy.  Unfortunately, she does not know the name of her medicine and it is not at all clear from chart review what she is supposed to be taking.  The medications listed in the chart are losartan 100 mg daily, losartan 50 mg daily, and lisinopril 40 mg daily.  Health maintenance Declines colonoscopy today.   OBJECTIVE:   BP (!) 160/88   Pulse 62   Ht 5\' 7"  (1.702 m)   Wt 187 lb 6.4 oz (85 kg)   LMP 07/04/2011   SpO2 98%   BMI 29.35 kg/m   Physical Exam Vitals reviewed. Exam conducted with a chaperone present.  Constitutional:      General: She is not in acute distress. Pulmonary:     Effort: Pulmonary effort is normal.  Abdominal:     General: Abdomen is flat.     Palpations: Abdomen is soft.  Genitourinary:    General: Normal vulva.     Vagina: Vaginal discharge (thin, white) present.     Cervix: Normal.  Neurological:     Mental Status: She is alert.      ASSESSMENT/PLAN:   Assessment & Plan Vaginal discharge Wet prep was negative today.  GC/chlamydia swabs collected.  Will also test HIV, RPR, hep C. Primary hypertension BP above goal x 2.  Unfortunately, not at all clear what she is taking/supposed to be taking.  Will have her return in 2 weeks for a PCP visit.  I have instructed her to bring in any medications that she has at home to complete full med rec with PCP.  Healthcare Maintenance Declines colonoscopy referral today. Discuss further with PCP at follow-up     J Dorothyann Gibbs, MD [facility] [address] [facility]

## 2023-06-07 NOTE — Patient Instructions (Addendum)
Mallory Lin,  I will call you if your tests come up positive and we need to do any kind of treatment.   Your blood pressure is pretty high today. Please come back in 2 weeks to see your PCP to discuss getting you back on your blood pressure medicines. You can also discuss age appropriate cancer screenings--including colon cancer screening.   I recommend the following vaccines which you can get at your local pharmacy: TDaP and Shingles (2 shot series)  J Dorothyann Gibbs, MD

## 2023-06-07 NOTE — Patient Instructions (Incomplete)
Ms. Bibbins,  It is great to meet you! I will call you if any of your tests come up positive and we need to treat you.  I have placed a referral to [facility] to get your colonoscopy. They will call you to set this up.   I recommend the following vaccines which you can get at your local pharmacy: TDaP and Shingles (2 shot series).  Eliezer Mccoy, MD

## 2023-06-08 LAB — HCV AB W REFLEX TO QUANT PCR: HCV Ab: NONREACTIVE

## 2023-06-08 LAB — HIV ANTIBODY (ROUTINE TESTING W REFLEX): HIV Screen 4th Generation wRfx: NONREACTIVE

## 2023-06-08 LAB — HCV INTERPRETATION

## 2023-06-08 LAB — RPR: RPR Ser Ql: NONREACTIVE

## 2023-06-10 LAB — CERVICOVAGINAL ANCILLARY ONLY
Chlamydia: NEGATIVE
Comment: NEGATIVE
Comment: NEGATIVE
Comment: NORMAL
Neisseria Gonorrhea: NEGATIVE
Trichomonas: NEGATIVE

## 2023-06-13 ENCOUNTER — Encounter: Payer: Self-pay | Admitting: Student

## 2023-08-15 DIAGNOSIS — N022 Recurrent and persistent hematuria with diffuse membranous glomerulonephritis: Secondary | ICD-10-CM | POA: Diagnosis not present

## 2023-08-15 DIAGNOSIS — I10 Essential (primary) hypertension: Secondary | ICD-10-CM | POA: Diagnosis not present

## 2023-08-15 DIAGNOSIS — R600 Localized edema: Secondary | ICD-10-CM | POA: Diagnosis not present

## 2023-08-15 DIAGNOSIS — N049 Nephrotic syndrome with unspecified morphologic changes: Secondary | ICD-10-CM | POA: Diagnosis not present

## 2023-08-15 DIAGNOSIS — N182 Chronic kidney disease, stage 2 (mild): Secondary | ICD-10-CM | POA: Diagnosis not present

## 2023-08-15 DIAGNOSIS — E559 Vitamin D deficiency, unspecified: Secondary | ICD-10-CM | POA: Diagnosis not present

## 2023-08-15 DIAGNOSIS — E876 Hypokalemia: Secondary | ICD-10-CM | POA: Diagnosis not present

## 2023-12-18 DIAGNOSIS — N049 Nephrotic syndrome with unspecified morphologic changes: Secondary | ICD-10-CM | POA: Diagnosis not present

## 2023-12-18 DIAGNOSIS — N186 End stage renal disease: Secondary | ICD-10-CM | POA: Diagnosis not present

## 2023-12-18 DIAGNOSIS — N022 Recurrent and persistent hematuria with diffuse membranous glomerulonephritis: Secondary | ICD-10-CM | POA: Diagnosis not present

## 2024-01-01 ENCOUNTER — Other Ambulatory Visit (HOSPITAL_COMMUNITY): Payer: Self-pay | Admitting: Nephrology

## 2024-01-01 DIAGNOSIS — N022 Recurrent and persistent hematuria with diffuse membranous glomerulonephritis: Secondary | ICD-10-CM | POA: Insufficient documentation

## 2024-01-02 ENCOUNTER — Telehealth (HOSPITAL_COMMUNITY): Payer: Self-pay | Admitting: Pharmacy Technician

## 2024-01-02 NOTE — Telephone Encounter (Signed)
 Auth Submission: NO AUTH NEEDED Site of care: [facility] Payer: [place] [employer] Medication & CPT/J Code(s) submitted: Rituxan  (Rituximab ) [id number] Diagnosis Code: N02.2 Route of submission (phone, fax, portal): portal Phone # Fax # Auth type: Buy/Bill HB Units/visits requested: 1000mg  q 14 days Request Tracking ID: [id number] Approval from: 01/02/24 to 04/15/24       Dagoberto Armour, CPhT [facility] Phone: [phone] 01/02/2024

## 2024-01-10 ENCOUNTER — Ambulatory Visit (HOSPITAL_COMMUNITY)
Admission: RE | Admit: 2024-01-10 | Discharge: 2024-01-10 | Disposition: A | Discharge status: Home / Self Care | Source: Ambulatory Visit | Attending: Nephrology | Admitting: Nephrology

## 2024-01-10 VITALS — BP 172/84 | HR 74 | Temp 98.2°F | Resp 16

## 2024-01-10 DIAGNOSIS — N022 Recurrent and persistent hematuria with diffuse membranous glomerulonephritis: Secondary | ICD-10-CM | POA: Diagnosis not present

## 2024-01-10 MED ORDER — ACETAMINOPHEN 325 MG PO TABS
ORAL_TABLET | ORAL | Status: AC
  Filled 2024-01-10: qty 2

## 2024-01-10 MED ORDER — ACETAMINOPHEN 325 MG PO TABS
650.0000 mg | ORAL_TABLET | Freq: Once | ORAL | Status: AC
  Administered 2024-01-10: 650 mg via ORAL

## 2024-01-10 MED ORDER — DIPHENHYDRAMINE HCL 50 MG/ML IJ SOLN
25.0000 mg | Freq: Once | INTRAMUSCULAR | Status: AC
  Administered 2024-01-10: 25 mg via INTRAVENOUS

## 2024-01-10 MED ORDER — SODIUM CHLORIDE 0.9 % IV SOLN
1000.0000 mg | Freq: Once | INTRAVENOUS | Status: AC
  Administered 2024-01-10: 1000 mg via INTRAVENOUS
  Filled 2024-01-10: qty 100

## 2024-01-10 MED ORDER — METHYLPREDNISOLONE SODIUM SUCC 125 MG IJ SOLR
INTRAMUSCULAR | Status: AC
  Filled 2024-01-10: qty 2

## 2024-01-10 MED ORDER — METHYLPREDNISOLONE SODIUM SUCC 125 MG IJ SOLR
125.0000 mg | Freq: Once | INTRAMUSCULAR | Status: AC
  Administered 2024-01-10: 125 mg via INTRAVENOUS

## 2024-01-10 MED ORDER — DIPHENHYDRAMINE HCL 50 MG/ML IJ SOLN
INTRAMUSCULAR | Status: AC
  Filled 2024-01-10: qty 1

## 2024-01-14 NOTE — Telephone Encounter (Signed)
 [facility] Patient Advocate Florencia Orts from [facility] called and informed the clinic that patient is approved for Free Drug from [employer]. Approval letter was sent with referral and is scanned into the media tab.

## 2024-01-24 ENCOUNTER — Ambulatory Visit (HOSPITAL_COMMUNITY)
Admission: RE | Admit: 2024-01-24 | Discharge: 2024-01-24 | Disposition: A | Discharge status: Home / Self Care | Source: Ambulatory Visit | Attending: Nephrology | Admitting: Nephrology

## 2024-01-24 VITALS — BP 174/91 | HR 66 | Temp 98.1°F | Resp 16

## 2024-01-24 DIAGNOSIS — N022 Recurrent and persistent hematuria with diffuse membranous glomerulonephritis: Secondary | ICD-10-CM | POA: Insufficient documentation

## 2024-01-24 MED ORDER — ACETAMINOPHEN 325 MG PO TABS
ORAL_TABLET | ORAL | Status: AC
  Filled 2024-01-24: qty 2

## 2024-01-24 MED ORDER — METHYLPREDNISOLONE SODIUM SUCC 125 MG IJ SOLR
INTRAMUSCULAR | Status: AC
  Filled 2024-01-24: qty 2

## 2024-01-24 MED ORDER — DIPHENHYDRAMINE HCL 50 MG/ML IJ SOLN
INTRAMUSCULAR | Status: AC
  Filled 2024-01-24: qty 1

## 2024-01-24 MED ORDER — ACETAMINOPHEN 325 MG PO TABS
650.0000 mg | ORAL_TABLET | Freq: Once | ORAL | Status: AC
  Administered 2024-01-24: 650 mg via ORAL

## 2024-01-24 MED ORDER — DIPHENHYDRAMINE HCL 50 MG/ML IJ SOLN
25.0000 mg | Freq: Once | INTRAMUSCULAR | Status: AC
  Administered 2024-01-24: 25 mg via INTRAVENOUS

## 2024-01-24 MED ORDER — METHYLPREDNISOLONE SODIUM SUCC 125 MG IJ SOLR
125.0000 mg | Freq: Once | INTRAMUSCULAR | Status: AC
  Administered 2024-01-24: 125 mg via INTRAVENOUS

## 2024-01-24 MED ORDER — SODIUM CHLORIDE 0.9 % IV SOLN
1000.0000 mg | Freq: Once | INTRAVENOUS | Status: AC
  Administered 2024-01-24: 1000 mg via INTRAVENOUS
  Filled 2024-01-24: qty 100

## 2024-02-18 DIAGNOSIS — N022 Recurrent and persistent hematuria with diffuse membranous glomerulonephritis: Secondary | ICD-10-CM | POA: Diagnosis not present

## 2024-03-20 DIAGNOSIS — N182 Chronic kidney disease, stage 2 (mild): Secondary | ICD-10-CM | POA: Diagnosis not present

## 2024-03-20 DIAGNOSIS — E559 Vitamin D deficiency, unspecified: Secondary | ICD-10-CM | POA: Diagnosis not present

## 2024-03-20 DIAGNOSIS — E876 Hypokalemia: Secondary | ICD-10-CM | POA: Diagnosis not present

## 2024-03-20 DIAGNOSIS — N022 Recurrent and persistent hematuria with diffuse membranous glomerulonephritis: Secondary | ICD-10-CM | POA: Diagnosis not present

## 2024-03-20 DIAGNOSIS — I129 Hypertensive chronic kidney disease with stage 1 through stage 4 chronic kidney disease, or unspecified chronic kidney disease: Secondary | ICD-10-CM | POA: Diagnosis not present

## 2024-03-20 DIAGNOSIS — R600 Localized edema: Secondary | ICD-10-CM | POA: Diagnosis not present

## 2024-03-25 ENCOUNTER — Ambulatory Visit (HOSPITAL_COMMUNITY): Admission: EM | Admit: 2024-03-25 | Discharge: 2024-03-25 | Disposition: A | Discharge status: Home / Self Care

## 2024-03-25 ENCOUNTER — Encounter (HOSPITAL_COMMUNITY): Payer: Self-pay

## 2024-03-25 DIAGNOSIS — G9331 Postviral fatigue syndrome: Secondary | ICD-10-CM | POA: Diagnosis not present

## 2024-03-25 MED ORDER — AZELASTINE HCL 0.1 % NA SOLN: 1.0000 | Freq: Two times a day (BID) | NASAL | 1 refills | Status: AC

## 2024-03-25 MED ORDER — PROMETHAZINE-DM 6.25-15 MG/5ML PO SYRP: 10.0000 mL | ORAL_SOLUTION | Freq: Three times a day (TID) | ORAL | 0 refills | Status: AC | PRN

## 2024-03-25 MED ORDER — PREDNISONE 20 MG PO TABS: 40.0000 mg | ORAL_TABLET | Freq: Every day | ORAL | 0 refills | Status: AC

## 2024-03-25 NOTE — ED Provider Notes (Signed)
 [facility]  Note:  This document was prepared using Conservation officer, historic buildings and may include unintentional dictation errors.  MRN: [id number] DOB: [birth year]  Subjective:   Mallory Lin is a 53 y.o. female presenting for cough and nasal congestion x 3 to 4 weeks.  Patient reports she has been taking NyQuil and Tylenol  with minimal to no improvement.  Patient states she has gone through 4 bottles of NyQuil in 4 weeks but is still coughing.  Patient reports that she was around someone who was coughing but their cough is gone away and hers has continued.  Patient denies any recent travel or known exposure to COVID, flu, strep.  No shortness of breath, chest pain, weakness, dizziness, fever.  No current facility-administered medications for this encounter.  Current Outpatient Medications:    azelastine (ASTELIN) 0.1 % nasal spray, Place 1 spray into both nostrils 2 (two) times daily. Use in each nostril as directed, Disp: 30 mL, Rfl: 1   FARXIGA 10 MG TABS tablet, Take 10 mg by mouth daily., Disp: , Rfl:    fluorometholone (FML) 0.1 % ophthalmic suspension, Place 1 drop into the right eye 2 (two) times daily., Disp: , Rfl:    losartan (COZAAR) 100 MG tablet, Take 100 mg by mouth daily., Disp: , Rfl:    potassium chloride  SA (KLOR-CON  M) 20 MEQ tablet, Take 20 mEq by mouth daily., Disp: , Rfl:    predniSONE (DELTASONE) 20 MG tablet, Take 2 tablets (40 mg total) by mouth daily for 5 days., Disp: 10 tablet, Rfl: 0   promethazine -dextromethorphan (PROMETHAZINE -DM) 6.25-15 MG/5ML syrup, Take 10 mLs by mouth 3 (three) times daily as needed for cough., Disp: 240 mL, Rfl: 0   atorvastatin (LIPITOR) 10 MG tablet, Take 10 mg by mouth daily., Disp: , Rfl:    furosemide  (LASIX ) 20 MG tablet, Take 2 tablets (40 mg total) by mouth 2 (two) times daily for 14 days., Disp: 56 tablet, Rfl: 0   methocarbamol  (ROBAXIN ) 500 MG tablet, Take 1 tablet (500 mg total) by mouth every 8 (eight)  hours as needed for muscle spasms., Disp: 20 tablet, Rfl: 0   ondansetron  (ZOFRAN -ODT) 4 MG disintegrating tablet, Take 1 tablet (4 mg total) by mouth every 8 (eight) hours as needed for nausea or vomiting., Disp: 20 tablet, Rfl: 0   No Known Allergies  Past Medical History:  Diagnosis Date   Bilateral leg edema 02/27/2019   BOILS, RECURRENT 01/12/2008   Qualifier: Diagnosis of  By: Jonelle MD, Erin     COVID-19 virus infection 03/27/2019   Recurrent boils    UTI (lower urinary tract infection)      Past Surgical History:  Procedure Laterality Date   HYDRADENITIS EXCISION Bilateral 06/06/2012   Procedure: EXCISION HYDRADENITIS AXILLA;  Surgeon: Krystal CHRISTELLA Spinner, MD;  Location: [facility];  Service: General;  Laterality: Bilateral;   INCISE AND DRAIN ABCESS     for hidradenitis   INCISION AND DRAINAGE ABSCESS ANAL     teeth extracted     TUBAL LIGATION      Family History  Problem Relation Age of Onset   Healthy Mother    Healthy Father     Social History   Tobacco Use   Smoking status: Never   Smokeless tobacco: Never  Vaping Use   Vaping status: Never Used  Substance Use Topics   Alcohol use: No   Drug use: No    ROS Refer to HPI for ROS details.  Objective:    Vitals: BP (!) 151/77 (BP Location: Left Arm)   Pulse 90   Temp 99 F (37.2 C) (Oral)   Resp 16   LMP 07/04/2011   SpO2 95%   Physical Exam Vitals and nursing note reviewed.  Constitutional:      General: She is not in acute distress.    Appearance: She is well-developed. She is not ill-appearing or toxic-appearing.  HENT:     Head: Normocephalic and atraumatic.     Nose: Congestion present. No rhinorrhea.     Mouth/Throat:     Mouth: Mucous membranes are moist.     Pharynx: Oropharynx is clear. No oropharyngeal exudate or posterior oropharyngeal erythema.  Eyes:     General:        Right eye: No discharge.        Left eye: No discharge.     Extraocular Movements: Extraocular  movements intact.     Conjunctiva/sclera: Conjunctivae normal.  Cardiovascular:     Rate and Rhythm: Normal rate.  Pulmonary:     Effort: Pulmonary effort is normal. No respiratory distress.     Breath sounds: No stridor. No wheezing.  Skin:    General: Skin is warm and dry.  Neurological:     General: No focal deficit present.     Mental Status: She is alert and oriented to person, place, and time.  Psychiatric:        Mood and Affect: Mood normal.        Behavior: Behavior normal.     Procedures  No results found for this or any previous visit (from the past 24 hours).  Assessment and Plan :     Discharge Instructions       1. Postviral syndrome (Primary) - azelastine (ASTELIN) 0.1 % nasal spray; Place 1 spray into both nostrils 2 (two) times daily. Use in each nostril as directed  Dispense: 30 mL; Refill: 1 - promethazine -dextromethorphan (PROMETHAZINE -DM) 6.25-15 MG/5ML syrup; Take 10 mLs by mouth 3 (three) times daily as needed for cough.  Dispense: 240 mL; Refill: 0 - predniSONE (DELTASONE) 20 MG tablet; Take 2 tablets (40 mg total) by mouth daily for 5 days.  Dispense: 10 tablet; Refill: 0  -Continue to monitor symptoms for any change in severity if there is any escalation of current symptoms or development of new symptoms follow-up in ER for further evaluation and management.      Ranbir Chew B Justyn Boyson   Jalena Vanderlinden, [place], [place] 03/25/24 1550

## 2024-03-25 NOTE — ED Triage Notes (Signed)
 Patient here today with c/o a cough and nasal congestion X 3-4 weeks. Patient has been taking Tylenol  and Nyquil with no relief. Patient states that she is on her 4th bottle of Nyquil. Patient was around someone who was coughing but there cough went away. No recent travel.

## 2024-03-25 NOTE — Discharge Instructions (Addendum)
°  1. Postviral syndrome (Primary) - azelastine (ASTELIN) 0.1 % nasal spray; Place 1 spray into both nostrils 2 (two) times daily. Use in each nostril as directed  Dispense: 30 mL; Refill: 1 - promethazine -dextromethorphan (PROMETHAZINE -DM) 6.25-15 MG/5ML syrup; Take 10 mLs by mouth 3 (three) times daily as needed for cough.  Dispense: 240 mL; Refill: 0 - predniSONE (DELTASONE) 20 MG tablet; Take 2 tablets (40 mg total) by mouth daily for 5 days.  Dispense: 10 tablet; Refill: 0  -Continue to monitor symptoms for any change in severity if there is any escalation of current symptoms or development of new symptoms follow-up in ER for further evaluation and management.
